# Patient Record
Sex: Male | Born: 1940 | Race: Black or African American | Hispanic: No | Marital: Single | Smoking: Former smoker
Health system: Southern US, Community
[De-identification: ages and names within clinical notes are randomized; demographics above are authoritative.]

## PROBLEM LIST (undated history)

## (undated) DIAGNOSIS — I10 Essential (primary) hypertension: Secondary | ICD-10-CM

## (undated) DIAGNOSIS — J449 Chronic obstructive pulmonary disease, unspecified: Secondary | ICD-10-CM

## (undated) DIAGNOSIS — I2699 Other pulmonary embolism without acute cor pulmonale: Secondary | ICD-10-CM

## (undated) DIAGNOSIS — I2510 Atherosclerotic heart disease of native coronary artery without angina pectoris: Secondary | ICD-10-CM

## (undated) DIAGNOSIS — I509 Heart failure, unspecified: Secondary | ICD-10-CM

## (undated) DIAGNOSIS — E119 Type 2 diabetes mellitus without complications: Secondary | ICD-10-CM

## (undated) HISTORY — PX: IVC FILTER INSERTION: CATH118245

## (undated) HISTORY — PX: PENILE PROSTHESIS IMPLANT: SHX240

---

## 2003-03-21 ENCOUNTER — Emergency Department (HOSPITAL_COMMUNITY): Admission: EM | Admit: 2003-03-21 | Discharge: 2003-03-21 | Payer: Self-pay | Admitting: Emergency Medicine

## 2004-10-25 ENCOUNTER — Emergency Department (HOSPITAL_COMMUNITY): Admission: EM | Admit: 2004-10-25 | Discharge: 2004-10-25 | Payer: Self-pay | Admitting: Emergency Medicine

## 2004-12-13 ENCOUNTER — Ambulatory Visit: Payer: Self-pay | Admitting: Family Medicine

## 2005-01-04 ENCOUNTER — Ambulatory Visit: Payer: Self-pay | Admitting: *Deleted

## 2005-01-08 ENCOUNTER — Emergency Department (HOSPITAL_COMMUNITY): Admission: EM | Admit: 2005-01-08 | Discharge: 2005-01-08 | Payer: Self-pay | Admitting: Emergency Medicine

## 2005-01-31 ENCOUNTER — Ambulatory Visit: Payer: Self-pay | Admitting: Nurse Practitioner

## 2005-03-26 ENCOUNTER — Ambulatory Visit: Payer: Self-pay | Admitting: Family Medicine

## 2005-03-28 ENCOUNTER — Ambulatory Visit: Payer: Self-pay | Admitting: Family Medicine

## 2005-07-05 ENCOUNTER — Emergency Department (HOSPITAL_COMMUNITY): Admission: EM | Admit: 2005-07-05 | Discharge: 2005-07-05 | Payer: Self-pay | Admitting: Emergency Medicine

## 2005-08-16 ENCOUNTER — Ambulatory Visit: Payer: Self-pay | Admitting: Family Medicine

## 2005-08-17 ENCOUNTER — Encounter (INDEPENDENT_AMBULATORY_CARE_PROVIDER_SITE_OTHER): Payer: Self-pay | Admitting: Family Medicine

## 2005-09-02 ENCOUNTER — Emergency Department (HOSPITAL_COMMUNITY): Admission: EM | Admit: 2005-09-02 | Discharge: 2005-09-02 | Payer: Self-pay | Admitting: Emergency Medicine

## 2005-10-11 ENCOUNTER — Emergency Department (HOSPITAL_COMMUNITY): Admission: EM | Admit: 2005-10-11 | Discharge: 2005-10-11 | Payer: Self-pay | Admitting: Emergency Medicine

## 2006-01-04 ENCOUNTER — Ambulatory Visit: Payer: Self-pay | Admitting: Family Medicine

## 2006-10-31 ENCOUNTER — Encounter (INDEPENDENT_AMBULATORY_CARE_PROVIDER_SITE_OTHER): Payer: Self-pay | Admitting: Family Medicine

## 2006-10-31 DIAGNOSIS — I10 Essential (primary) hypertension: Secondary | ICD-10-CM

## 2006-10-31 DIAGNOSIS — G609 Hereditary and idiopathic neuropathy, unspecified: Secondary | ICD-10-CM | POA: Insufficient documentation

## 2006-10-31 DIAGNOSIS — E119 Type 2 diabetes mellitus without complications: Secondary | ICD-10-CM | POA: Insufficient documentation

## 2006-10-31 DIAGNOSIS — R569 Unspecified convulsions: Secondary | ICD-10-CM

## 2006-12-06 ENCOUNTER — Ambulatory Visit: Payer: Self-pay | Admitting: Family Medicine

## 2006-12-11 ENCOUNTER — Ambulatory Visit: Payer: Self-pay | Admitting: Family Medicine

## 2007-01-10 ENCOUNTER — Ambulatory Visit: Payer: Self-pay | Admitting: Family Medicine

## 2007-01-17 ENCOUNTER — Ambulatory Visit: Payer: Self-pay | Admitting: Family Medicine

## 2007-03-27 ENCOUNTER — Ambulatory Visit: Payer: Self-pay | Admitting: Family Medicine

## 2007-04-08 ENCOUNTER — Inpatient Hospital Stay (HOSPITAL_BASED_OUTPATIENT_CLINIC_OR_DEPARTMENT_OTHER): Admission: RE | Admit: 2007-04-08 | Discharge: 2007-04-08 | Payer: Self-pay | Admitting: *Deleted

## 2007-09-11 ENCOUNTER — Ambulatory Visit: Payer: Self-pay | Admitting: Family Medicine

## 2007-10-06 ENCOUNTER — Ambulatory Visit: Payer: Self-pay | Admitting: Family Medicine

## 2008-06-10 ENCOUNTER — Ambulatory Visit: Payer: Self-pay | Admitting: Family Medicine

## 2008-06-11 ENCOUNTER — Encounter: Admission: RE | Admit: 2008-06-11 | Discharge: 2008-06-11 | Payer: Self-pay | Admitting: Family Medicine

## 2008-08-17 ENCOUNTER — Emergency Department (HOSPITAL_COMMUNITY): Admission: EM | Admit: 2008-08-17 | Discharge: 2008-08-17 | Payer: Self-pay | Admitting: Emergency Medicine

## 2008-09-02 ENCOUNTER — Ambulatory Visit: Payer: Self-pay | Admitting: Family Medicine

## 2010-05-16 NOTE — Cardiovascular Report (Signed)
NAME:  Jason Joyce, Jason Joyce NO.:  [id number]   MEDICAL RECORD NO.:  [id number]          PATIENT TYPE:  OIB   LOCATION:  1962                         FACILITY:  [facility]   PHYSICIAN:  Elmore Guise., M.D.DATE OF BIRTH:  [birth year]   DATE OF PROCEDURE:  04/08/2007  DATE OF DISCHARGE:                            CARDIAC CATHETERIZATION   INDICATIONS FOR PROCEDURE:  Increasing exertional dyspnea, chest pain  and abnormal stress test.   DESCRIPTION OF PROCEDURE:  The patient was brought to the cardiac cath  lab.  After appropriate informed consent he is prepped and draped in  sterile fashion.  Approximately 15 mL of 1% lidocaine was used for local  anesthesia.  A 4-French sheath placed in the right femoral artery  without difficulty.  Coronary angiography, LV angiography were then  performed.  The patient was transferred from the cardiac cath lab in  stable condition.   FINDINGS:  1. Left Main:  Normal.  2. Left anterior descending:  Large vessel with mild luminal      irregularities.  3. D1:  Mild luminal irregularities.  4. Ramus intermedius:  Moderate-sized vessel mild luminal      irregularities.  5. Left circumflex:  Nondominant large vessel mild luminal      irregularities.  6. Obtuse marginal 1:  Large vessel mild luminal irregularities.  7. Right coronary artery:  Dominant with mild luminal irregularities.  8. Left ventricle:  Ejection fraction is 55-60%.  No wall motion      abnormalities.  LV EDP is 28 mmHg.   IMPRESSION:  1. 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  2. Preserved left ventricular systolic function with an ejection      fraction 55%.  3. Elevated LV EDP at 28 mmHg.   PLAN:  At this time I would recommend continued aggressive medical  treatment as well as risk factor modification as indicated.      Elmore Guise., M.D.  Electronically Signed     TWK/MEDQ  D:  04/08/2007  T:  04/08/2007  Job:   [id number]

## 2010-05-16 NOTE — H&P (Signed)
NAME:  Jason Joyce, Jason Joyce NO.:  [id number]   MEDICAL RECORD NO.:  [id number]           PATIENT TYPE:   LOCATION:                                 FACILITY:   PHYSICIAN:  Elmore Guise., M.D.DATE OF BIRTH:  06-07-1940   DATE OF ADMISSION:  DATE OF DISCHARGE:                              HISTORY & PHYSICAL   INDICATION FOR PROCEDURE:  Exertional dyspnea and chest tightness.  Patient with abnormal stress test, now referred for cardiac  catheterization.   HISTORY OF PRESENT ILLNESS:  Jason Joyce is a 70 year old, African-  American male with past medical history of diabetes mellitus,  dyslipidemia, hypertension, obesity who presented for evaluation of  increasing exertional dyspnea.  He also reports occasional episodes of  chest tightness.  He was referred for exercise stress testing.  This was  done on March 31.  This showed abnormal study with a small reversible  inferior wall defect and an EF of 37%.  He has been seen back in the  office.  He continues to have exertional dyspnea.  He was started on  Coreg 3.125 mg twice daily.  He is now referred for cardiac  catheterization.  He denies any recent fever or cough.  He does have  occasional lower extremity edema as well as occasional episodes of  palpitations.  He had no presyncope or syncope.  He reports compliance  with his medications.   REVIEW OF SYSTEMS:  Are as per HPI.  All others are negative.   CURRENT MEDICATIONS:  1. Actos Plus 15/850 mg twice daily.  2. Lovastatin 20 mg daily.  3. Lisinopril hydrochlorothiazide 10/12.5 mg daily.  4. Glipizide 5 mg twice daily.  5. Aspirin 81 mg daily.   PHYSICAL EXAMINATION:  His weight is 246 pounds.  His blood pressure is  130/90.  His heart rate is 64 and regular.  In general, he is a very pleasant middle-aged, African-American male,  alert and oriented x4 in no acute distress.  He has no JVD and no  bruits.  LUNGS:  Were clear with good breath sounds at the  bases.  HEART:  Is regular with normal S1-S2.  ABDOMEN:  Is obese, soft, nontender, nondistended.  No rebound or  guarding.  EXTREMITIES:  Warm with 2+ pulses and no significant edema.  He has 2+  femoral pulse without any bruit noted.   His stress test was reviewed with him at length.  His echocardiogram was  also reviewed.  His echocardiogram showed an EF of 55-60% with diastolic  dysfunction, mild aortic valve sclerosis without stenosis, and trivial  mitral valve regurgitation.  His nuclear study with gaited imaging  showed a decrease in EF after stress with an EF estimated at 37%, and  with this small reversible inferior wall defect, he will now be referred  for cardiac catheterization.  His blood work is pending at time of  dictation.  His last chest x-ray was done March 27, 2007 and was normal.   IMPRESSION:  1. Abnormal stress Cardiolite.  2. Multiple cardiac risk factors including hypertension, diabetes,  dyslipidemia, morbid obesity.   PLAN:  The patient will be scheduled for cardiac catheterization.  He  was started on Coreg 3.125 mg twice daily.  He will have routine blood  work done today prior to his study next Tuesday.  All his questions were  answered.  He does wish to proceed.      Elmore Guise., M.D.  Electronically Signed     TWK/MEDQ  D:  04/04/2007  T:  04/04/2007  Job:  [id number]

## 2019-04-12 ENCOUNTER — Other Ambulatory Visit: Payer: Self-pay

## 2019-04-12 ENCOUNTER — Encounter (HOSPITAL_COMMUNITY): Payer: Self-pay | Admitting: Emergency Medicine

## 2019-04-12 ENCOUNTER — Emergency Department (HOSPITAL_COMMUNITY): Payer: Medicare PPO

## 2019-04-12 ENCOUNTER — Emergency Department (HOSPITAL_COMMUNITY)
Admission: EM | Admit: 2019-04-12 | Discharge: 2019-04-13 | Disposition: A | Discharge status: Home / Self Care | Payer: Medicare PPO | Attending: Emergency Medicine | Admitting: Emergency Medicine

## 2019-04-12 DIAGNOSIS — E119 Type 2 diabetes mellitus without complications: Secondary | ICD-10-CM | POA: Diagnosis not present

## 2019-04-12 DIAGNOSIS — I10 Essential (primary) hypertension: Secondary | ICD-10-CM | POA: Insufficient documentation

## 2019-04-12 DIAGNOSIS — R072 Precordial pain: Secondary | ICD-10-CM | POA: Insufficient documentation

## 2019-04-12 DIAGNOSIS — R0602 Shortness of breath: Secondary | ICD-10-CM | POA: Insufficient documentation

## 2019-04-12 DIAGNOSIS — R079 Chest pain, unspecified: Secondary | ICD-10-CM | POA: Diagnosis present

## 2019-04-12 LAB — CBC
HCT: 35.2 % — ABNORMAL LOW (ref 39.0–52.0)
Hemoglobin: 10.8 g/dL — ABNORMAL LOW (ref 13.0–17.0)
MCH: 27.3 pg (ref 26.0–34.0)
MCHC: 30.7 g/dL (ref 30.0–36.0)
MCV: 89.1 fL (ref 80.0–100.0)
Platelets: 265 10*3/uL (ref 150–400)
RBC: 3.95 MIL/uL — ABNORMAL LOW (ref 4.22–5.81)
RDW: 13.6 % (ref 11.5–15.5)
WBC: 5 10*3/uL (ref 4.0–10.5)
nRBC: 0 % (ref 0.0–0.2)

## 2019-04-12 LAB — COMPREHENSIVE METABOLIC PANEL
ALT: 17 U/L (ref 0–44)
AST: 19 U/L (ref 15–41)
Albumin: 3.2 g/dL — ABNORMAL LOW (ref 3.5–5.0)
Alkaline Phosphatase: 93 U/L (ref 38–126)
Anion gap: 9 (ref 5–15)
BUN: 18 mg/dL (ref 8–23)
CO2: 24 mmol/L (ref 22–32)
Calcium: 9 mg/dL (ref 8.9–10.3)
Chloride: 104 mmol/L (ref 98–111)
Creatinine, Ser: 1.35 mg/dL — ABNORMAL HIGH (ref 0.61–1.24)
GFR calc Af Amer: 57 mL/min — ABNORMAL LOW (ref >60)
GFR calc non Af Amer: 50 mL/min — ABNORMAL LOW (ref >60)
Glucose, Bld: 349 mg/dL — ABNORMAL HIGH (ref 70–99)
Potassium: 4.2 mmol/L (ref 3.5–5.1)
Sodium: 137 mmol/L (ref 135–145)
Total Bilirubin: 0.4 mg/dL (ref 0.3–1.2)
Total Protein: 6.7 g/dL (ref 6.5–8.1)

## 2019-04-12 LAB — PROTIME-INR
INR: 1 (ref 0.8–1.2)
Prothrombin Time: 13 seconds (ref 11.4–15.2)

## 2019-04-12 LAB — BRAIN NATRIURETIC PEPTIDE: B Natriuretic Peptide: 17.1 pg/mL (ref 0.0–100.0)

## 2019-04-12 LAB — TROPONIN I (HIGH SENSITIVITY): Troponin I (High Sensitivity): 22 ng/L — ABNORMAL HIGH (ref <18)

## 2019-04-12 MED ORDER — INSULIN ASPART 100 UNIT/ML ~~LOC~~ SOLN
12.0000 [IU] | Freq: Once | SUBCUTANEOUS | Status: AC
  Administered 2019-04-13: 12 [IU] via SUBCUTANEOUS

## 2019-04-12 NOTE — ED Provider Notes (Signed)
Plan to f/u on repeat troponin Check med recs May be amenable for discharge   Zadie Rhine, MD 04/12/19 2331

## 2019-04-12 NOTE — ED Provider Notes (Signed)
[facility] EMERGENCY DEPARTMENT Provider Note   CSN: [id number] Arrival date & time: 04/12/19  2206     History Chief Complaint  Patient presents with  . Chest Pain    Jason Joyce is a 79 y.o. male.  Patient with hx htn, dm, presents c/o mid chest pain in past couple days. Symptoms occur at rest, moderate, constant, persistent, dull, non radiating. No associated nv, or diaphoresis. No pleuritic pain. Mild sob/doe. +bilateral lower leg/ankle edema. Pt indicates he has been out of his medications for 'long while'. States he is from [place], and that more recently is living in [place] as family in area, but has no local doctor, and not been able to get his meds since leaving [place]. Also states wants to get his penile prosthesis checked to make sure it isnt leaking. No acute scrotal, testicular or penile pain, no skin lesions, drainage or redness to area. No fever or chills.   The history is provided by the patient.  Chest Pain Associated symptoms: cough and shortness of breath   Associated symptoms: no abdominal pain, no back pain, no fever, no headache, no palpitations and no vomiting        History reviewed. No pertinent past medical history.  Patient Active Problem List   Diagnosis Date Noted  . DIABETES MELLITUS, TYPE II 10/31/2006  . PERIPHERAL NEUROPATHY 10/31/2006  . HYPERTENSION 10/31/2006  . SEIZURE DISORDER 10/31/2006    History reviewed. No pertinent surgical history.     History reviewed. No pertinent family history.  Social History   Tobacco Use  . Smoking status: Not on file  Substance Use Topics  . Alcohol use: Not on file  . Drug use: Not on file    Home Medications Prior to Admission medications   Not on File    Allergies    Patient has no allergy information on record.  Review of Systems   Review of Systems  Constitutional: Negative for chills and fever.  HENT: Negative for sore throat.   Eyes: Negative for redness.    Respiratory: Positive for cough and shortness of breath.   Cardiovascular: Positive for chest pain and leg swelling. Negative for palpitations.  Gastrointestinal: Negative for abdominal pain, diarrhea and vomiting.  Endocrine: Negative for polyuria.  Genitourinary: Negative for dysuria and flank pain.  Musculoskeletal: Negative for back pain and neck pain.  Skin: Negative for rash.  Neurological: Negative for headaches.  Hematological: Does not bruise/bleed easily.  Psychiatric/Behavioral: Negative for confusion.    Physical Exam Updated Vital Signs BP 139/89 (BP Location: Right Arm)   Pulse 98   Temp 98.4 F (36.9 C) (Oral)   Resp (!) 24   Ht 1.803 m (5\' 11" )   Wt 124.7 kg   SpO2 94%   BMI 38.35 kg/m   Physical Exam Vitals and nursing note reviewed.  Constitutional:      Appearance: Normal appearance. He is well-developed.  HENT:     Head: Atraumatic.     Nose: Nose normal.     Mouth/Throat:     Mouth: Mucous membranes are moist.     Pharynx: Oropharynx is clear.  Eyes:     General: No scleral icterus.    Conjunctiva/sclera: Conjunctivae normal.     Pupils: Pupils are equal, round, and reactive to light.  Neck:     Trachea: No tracheal deviation.  Cardiovascular:     Rate and Rhythm: Normal rate and regular rhythm.     Pulses: Normal pulses.  Heart sounds: Normal heart sounds. No murmur. No friction rub. No gallop.   Pulmonary:     Effort: Pulmonary effort is normal. No accessory muscle usage or respiratory distress.     Breath sounds: Normal breath sounds.  Abdominal:     General: Bowel sounds are normal. There is no distension.     Palpations: Abdomen is soft.     Tenderness: There is no abdominal tenderness. There is no guarding.  Genitourinary:    Comments: No cva tenderness. Normal external gu. Semi-erect penis/prosthesis. No skin changes, sts, or erythema to area. No focal tenderness.  Musculoskeletal:        General: No tenderness.     Cervical  back: Normal range of motion and neck supple. No rigidity.     Comments: Mild symmetric bilateral leg/ankle edema.   Skin:    General: Skin is warm and dry.     Findings: No rash.  Neurological:     Mental Status: He is alert.     Comments: Alert, speech clear.   Psychiatric:        Mood and Affect: Mood normal.     ED Results / Procedures / Treatments   Labs (all labs ordered are listed, but only abnormal results are displayed) Results for orders placed or performed during the hospital encounter of 04/12/19  CBC  Result Value Ref Range   WBC 5.0 4.0 - 10.5 K/uL   RBC 3.95 (L) 4.22 - 5.81 MIL/uL   Hemoglobin 10.8 (L) 13.0 - 17.0 g/dL   HCT 84.6 (L) 96.2 - 95.2 %   MCV 89.1 80.0 - 100.0 fL   MCH 27.3 26.0 - 34.0 pg   MCHC 30.7 30.0 - 36.0 g/dL   RDW 84.1 32.4 - 40.1 %   Platelets 265 150 - 400 K/uL   nRBC 0.0 0.0 - 0.2 %  Comprehensive metabolic panel  Result Value Ref Range   Sodium 137 135 - 145 mmol/L   Potassium 4.2 3.5 - 5.1 mmol/L   Chloride 104 98 - 111 mmol/L   CO2 24 22 - 32 mmol/L   Glucose, Bld 349 (H) 70 - 99 mg/dL   BUN 18 8 - 23 mg/dL   Creatinine, Ser 0.27 (H) 0.61 - 1.24 mg/dL   Calcium 9.0 8.9 - 25.3 mg/dL   Total Protein 6.7 6.5 - 8.1 g/dL   Albumin 3.2 (L) 3.5 - 5.0 g/dL   AST 19 15 - 41 U/L   ALT 17 0 - 44 U/L   Alkaline Phosphatase 93 38 - 126 U/L   Total Bilirubin 0.4 0.3 - 1.2 mg/dL   GFR calc non Af Amer 50 (L) >60 mL/min   GFR calc Af Amer 57 (L) >60 mL/min   Anion gap 9 5 - 15  Brain natriuretic peptide  Result Value Ref Range   B Natriuretic Peptide 17.1 0.0 - 100.0 pg/mL  Protime-INR  Result Value Ref Range   Prothrombin Time 13.0 11.4 - 15.2 seconds   INR 1.0 0.8 - 1.2  Troponin I (High Sensitivity)  Result Value Ref Range   Troponin I (High Sensitivity) 22 (H) <18 ng/L    EKG EKG Interpretation  Date/Time:  Sunday April 12 2019 22:20:19 EDT Ventricular Rate:  100 PR Interval:    QRS Duration: 144 QT Interval:  396 QTC  Calculation: 506 R Axis:   0 Text Interpretation: Sinus tachycardia Premature ventricular complexes Right bundle branch block Non-specific ST-t changes Baseline wander Confirmed by Cathren Laine ([id number])  on 04/12/2019 10:38:30 PM   Radiology DG Chest Port 1 View  Result Date: 04/12/2019 CLINICAL DATA:  Chest pain abdominal swelling EXAM: PORTABLE CHEST 1 VIEW COMPARISON:  06/11/2008 FINDINGS: Mild elevation of the left diaphragm with basilar atelectasis. Mild cardiomegaly with central vascular congestion. No pleural effusion or pneumothorax IMPRESSION: 1. Cardiomegaly with mild central vascular congestion 2. Mild left basilar atelectasis Electronically Signed   By: Jasmine Pang M.D.   On: 04/12/2019 23:15    Procedures Procedures (including critical care time)  Medications Ordered in ED Medications - No data to display  ED Course  I have reviewed the triage vital signs and the nursing notes.  Pertinent labs & imaging results that were available during my care of the patient were reviewed by me and considered in my medical decision making (see chart for details).    MDM Rules/Calculators/A&P                      Iv ns. Stat labs. Pcxr. Ecg.   Reviewed nursing notes and prior charts for additional history.  No recent records available in Care Everywhere.   Initial labs reviewed/interpreted by me  - wbc normal, hgb 10.8.   CXR reviewed/interpreted by me - no pna. +mild vascular congestion.   Will ask pharmacy to do med rec - pt is unclear on meds/doses.   Additional labs reviewed/interpreted by me - glucose high, 349, hco3 normal. novolog sq.   Additional labs/bnp and delta trop remain pending - signed out to Dr Bebe Shaggy - check labs when resulted. If delta trop not increasing, potential d/c w close outpt f/u. If delta trop increasing, and/or bnp significantly high, would admit to medicine.      Final Clinical Impression(s) / ED Diagnoses Final diagnoses:  None    Rx / DC  Orders ED Discharge Orders    None       Cathren Laine, MD 04/12/19 2325

## 2019-04-12 NOTE — ED Triage Notes (Signed)
Pt BIB [facility], c/o chest pain, abdominal swelling/distension that started yesterday and BLE edema. Given 1NTG and 324mg  asa with no change in pain. Hx blood clots. Pt also concerned that his penile implant has busted, and he thinks it is leaking. A&O x 4.

## 2019-04-13 LAB — TROPONIN I (HIGH SENSITIVITY): Troponin I (High Sensitivity): 20 ng/L — ABNORMAL HIGH (ref <18)

## 2019-04-13 NOTE — ED Notes (Signed)
States he has not had any medicine for 9-10 months, since he was in a facility in [place] for covid

## 2019-04-13 NOTE — ED Provider Notes (Signed)
Social workers evaluated the patient and have established a plan for him for follow-up.  We will discharge him.   Melene Plan, DO 04/13/19 [phone]

## 2019-04-13 NOTE — ED Provider Notes (Signed)
Repeat troponin improved.  Patient is in no acute distress. Patient has multiple issues that need to be addressed including transportation home, PCP  follow-up, medication assistance. He does not have any local primary care and cannot recall all of his medications. Due to the time of night and difficulty getting home, patient will remain in the ER until the morning and see social work.  They can assist him with primary care placement this week as well as medication assistant.  It will need to be during the daylight to contact the pharmacy to ensure which medications he may need. Patient otherwise stable and appropriate   Zadie Rhine, MD 04/13/19 0151

## 2019-04-13 NOTE — ED Notes (Signed)
Pt states that he can get a ride home, by a church member.

## 2019-04-22 ENCOUNTER — Inpatient Hospital Stay (INDEPENDENT_AMBULATORY_CARE_PROVIDER_SITE_OTHER): Payer: Medicare PPO | Admitting: Primary Care

## 2019-07-03 ENCOUNTER — Other Ambulatory Visit: Payer: Self-pay | Admitting: General Practice

## 2019-07-03 DIAGNOSIS — T8361XA Infection and inflammatory reaction due to implanted penile prosthesis, initial encounter: Secondary | ICD-10-CM

## 2019-07-07 ENCOUNTER — Ambulatory Visit (INDEPENDENT_AMBULATORY_CARE_PROVIDER_SITE_OTHER): Payer: Medicare PPO | Admitting: Ophthalmology

## 2019-07-07 ENCOUNTER — Encounter (INDEPENDENT_AMBULATORY_CARE_PROVIDER_SITE_OTHER): Payer: Self-pay | Admitting: Ophthalmology

## 2019-07-07 ENCOUNTER — Other Ambulatory Visit: Payer: Self-pay

## 2019-07-07 DIAGNOSIS — H211X1 Other vascular disorders of iris and ciliary body, right eye: Secondary | ICD-10-CM

## 2019-07-07 DIAGNOSIS — H34811 Central retinal vein occlusion, right eye, with macular edema: Secondary | ICD-10-CM | POA: Diagnosis not present

## 2019-07-07 DIAGNOSIS — H2513 Age-related nuclear cataract, bilateral: Secondary | ICD-10-CM

## 2019-07-07 MED ORDER — BEVACIZUMAB CHEMO INJECTION 1.25MG/0.05ML SYRINGE FOR KALEIDOSCOPE
1.2500 mg | INTRAVITREAL | Status: AC | PRN
  Administered 2019-07-07: 1.25 mg via INTRAVITREAL

## 2019-07-07 NOTE — Progress Notes (Signed)
07/07/2019     CHIEF COMPLAINT Patient presents for Retina Evaluation   HISTORY OF PRESENT ILLNESS: Jason Joyce is a 79 y.o. male who presents to the clinic today for:   HPI    Retina Evaluation    In right eye.  This started 2 years ago.  Duration of 2 years.  Associated Symptoms Blind Spot.  Context:  distance vision, mid-range vision and near vision.          Comments    NP CME OU, CRVO OD - Ref'd by Dr. Ernesto Rutherford  Pt c/o loss of VA OD x 1-2 years. Pt sts, "I can't remember the doctor, but they were shooting a needle in my eyeball." Pt denies noticeable changes to VA OS. LBS: 107 this AM A1c: pt does not recall, "I know it was high."  Patient last injected in the right eyes some 1 year previously.  He reports having caught the virus and then with hospitalization was unable to return to the practice.       Last edited by Edmon Crape, MD on 07/07/2019  9:38 AM. (History)      Referring physician: Ernesto Rutherford, MD [address] [place],  [place] [address]  HISTORICAL INFORMATION:   Selected notes from the MEDICAL RECORD NUMBER       CURRENT MEDICATIONS: Current Outpatient Medications (Ophthalmic Drugs)  Medication Sig  . latanoprost (XALATAN) 0.005 % ophthalmic solution    No current facility-administered medications for this visit. (Ophthalmic Drugs)   No current outpatient medications on file. (Other)   No current facility-administered medications for this visit. (Other)      REVIEW OF SYSTEMS:    ALLERGIES No Known Allergies  PAST MEDICAL HISTORY History reviewed. No pertinent past medical history. History reviewed. No pertinent surgical history.  FAMILY HISTORY History reviewed. No pertinent family history.  SOCIAL HISTORY Social History   Tobacco Use  . Smoking status: Former Games developer  . Smokeless tobacco: Never Used  Substance Use Topics  . Alcohol use: Not on file  . Drug use: Not on file         OPHTHALMIC EXAM:  Base  Eye Exam    Visual Acuity (ETDRS)      Right Left   Dist [place] HM 20/60 -2   Dist ph  NI 20/40 -2       Tonometry (Tonopen, 9:21 AM)      Right Left   Pressure 31 14       Tonometry #2 (Tonopen, 9:21 AM)      Right Left   Pressure 32        Pupils      Pupils Dark Light Shape React APD   Right PERRL 4 3 Round Brisk +1   Left PERRL 4 3 Round Brisk None       Visual Fields (Counting fingers)      Left Right    Full    Restrictions  Total superior temporal, inferior temporal, superior nasal, inferior nasal deficiencies       Extraocular Movement      Right Left    Full Full       Neuro/Psych    Oriented x3: Yes   Mood/Affect: Normal       Dilation    Left eye: 1.0% Mydriacyl, 2.5% Phenylephrine @ 9:24 AM        Slit Lamp and Fundus Exam    External Exam      Right  Left   External Normal Normal       Slit Lamp Exam      Right Left   Lids/Lashes Normal Normal   Conjunctiva/Sclera White and quiet White and quiet   Cornea Clear Clear   Anterior Chamber Deep and quiet Deep and quiet   Iris Neovascularization,.  Pupillary nearly 360 Round and reactive   Lens 2+ Nuclear sclerosis 2+ Nuclear sclerosis   Anterior Vitreous Normal Normal          IMAGING AND PROCEDURES  Imaging and Procedures for 07/07/19  OCT, Retina - OU - Both Eyes       Right Eye Quality was good. Scan locations included subfoveal. Central Foveal Thickness: 698. Progression has worsened. Findings include abnormal foveal contour, cystoid macular edema.   Left Eye Quality was good. Scan locations included subfoveal. Central Foveal Thickness: 249. Progression has no prior data. Findings include abnormal foveal contour, vitreomacular adhesion .   Notes Massive retinal thickening deep, likely from ischemic central retinal vein occlusion.  We will resume intravitreal antiveg F therapy OD promptly   OS, some foveal atrophy temporal aspect of macula       Intravitreal Injection,  Pharmacologic Agent - OD - Right Eye       Time Out 07/07/2019. 9:54 AM. Confirmed correct patient, procedure, site, and patient consented.   Anesthesia Topical anesthesia was used. Anesthetic medications included Akten 3.5%.   Procedure Preparation included Tobramycin 0.3%, 10% betadine to eyelids. A 30 gauge needle was used.   Injection:  1.25 mg Bevacizumab (AVASTIN) SOLN   NDC: [address]-2, Lot: [id number]   Route: Intravitreal, Site: Right Eye, Waste: 0 mg  Post-op Post injection exam found visual acuity of at least counting fingers. The patient tolerated the procedure well. There were no complications. The patient received written and verbal post procedure care education. Post injection medications were not given.                 ASSESSMENT/PLAN:  No problem-specific Assessment & Plan notes found for this encounter.      ICD-10-CM   1. 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 Nuclear sclerotic cataract of both eyes  H25.13     1.  OD, not dilated today due to early elevated intraocular pressure.  2.  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  3.  Vitreal Avastin OD today 4.  With neovascularization of the iris, and neovascular disease, will need to return for follow-up visit for PRP #1 in 2 weeks  Ophthalmic Meds Ordered this visit:  Meds ordered this encounter  Medications  . Bevacizumab (AVASTIN) SOLN 1.25 mg       Return in about 4 weeks (around 08/04/2019) for dilate, OD, AVASTIN OCT.  There are no Patient Instructions on file for this visit.   Explained the diagnoses, plan, and follow up with the patient and they expressed understanding.   Patient expressed understanding of the importance of proper follow up care.   Alford Highland Luisangel Wainright M.D. Diseases & Surgery of the Retina and Vitreous [facility] 07/07/19     Abbreviations: M myopia (nearsighted); A astigmatism; H hyperopia (  farsighted); P presbyopia; Mrx spectacle prescription;  CTL contact lenses; OD right eye; OS left eye; OU both eyes  XT exotropia; ET esotropia; PEK punctate epithelial keratitis; PEE punctate epithelial erosions; DES dry eye syndrome; MGD meibomian gland dysfunction; ATs artificial tears; PFAT's preservative free artificial tears; NSC nuclear sclerotic cataract; PSC posterior subcapsular cataract; ERM epi-retinal membrane; PVD posterior vitreous detachment; RD retinal detachment; DM diabetes mellitus; DR diabetic retinopathy; NPDR non-proliferative diabetic retinopathy; PDR proliferative diabetic retinopathy; CSME clinically significant macular edema; DME diabetic macular edema; dbh dot blot hemorrhages; CWS cotton wool spot; POAG primary open angle glaucoma; C/D cup-to-disc ratio; HVF humphrey visual field; GVF goldmann visual field; OCT optical coherence tomography; IOP intraocular pressure; BRVO Branch retinal vein occlusion; CRVO central retinal vein occlusion; CRAO central retinal artery occlusion; BRAO branch retinal artery occlusion; RT retinal tear; SB scleral buckle; PPV pars plana vitrectomy; VH Vitreous hemorrhage; PRP panretinal laser photocoagulation; IVK intravitreal kenalog; VMT vitreomacular traction; MH Macular hole;  NVD neovascularization of the disc; NVE neovascularization elsewhere; AREDS age related eye disease study; ARMD age related macular degeneration; POAG primary open angle glaucoma; EBMD epithelial/anterior basement membrane dystrophy; ACIOL anterior chamber intraocular lens; IOL intraocular lens; PCIOL posterior chamber intraocular lens; Phaco/IOL phacoemulsification with intraocular lens placement; PRK photorefractive  keratectomy; LASIK laser assisted in situ keratomileusis; HTN hypertension; DM diabetes mellitus; COPD chronic obstructive pulmonary disease

## 2019-07-08 ENCOUNTER — Other Ambulatory Visit: Payer: Self-pay

## 2019-07-08 LAB — HEMOGLOBIN A1C: A1c: 11.1

## 2019-07-10 ENCOUNTER — Ambulatory Visit
Admission: RE | Admit: 2019-07-10 | Discharge: 2019-07-10 | Disposition: A | Discharge status: Home / Self Care | Payer: Medicare PPO | Source: Ambulatory Visit | Attending: General Practice | Admitting: General Practice

## 2019-07-10 DIAGNOSIS — T8361XA Infection and inflammatory reaction due to implanted penile prosthesis, initial encounter: Secondary | ICD-10-CM

## 2019-07-14 ENCOUNTER — Ambulatory Visit (INDEPENDENT_AMBULATORY_CARE_PROVIDER_SITE_OTHER): Payer: Medicare PPO

## 2019-07-14 ENCOUNTER — Encounter: Payer: Self-pay | Admitting: Internal Medicine

## 2019-07-14 ENCOUNTER — Other Ambulatory Visit: Payer: Self-pay

## 2019-07-14 ENCOUNTER — Ambulatory Visit (INDEPENDENT_AMBULATORY_CARE_PROVIDER_SITE_OTHER): Payer: Medicare PPO | Admitting: Internal Medicine

## 2019-07-14 DIAGNOSIS — I10 Essential (primary) hypertension: Secondary | ICD-10-CM | POA: Diagnosis not present

## 2019-07-14 DIAGNOSIS — R0600 Dyspnea, unspecified: Secondary | ICD-10-CM | POA: Diagnosis not present

## 2019-07-14 DIAGNOSIS — Z95828 Presence of other vascular implants and grafts: Secondary | ICD-10-CM | POA: Insufficient documentation

## 2019-07-14 DIAGNOSIS — R0609 Other forms of dyspnea: Secondary | ICD-10-CM

## 2019-07-14 MED ORDER — DOXAZOSIN MESYLATE 4 MG PO TABS: 4.0000 mg | ORAL_TABLET | Freq: Two times a day (BID) | ORAL | 2 refills | Status: AC

## 2019-07-14 NOTE — Assessment & Plan Note (Addendum)
Due to leg swelling/ difficulty urinating >>> D/c nifedipine  rx cardura 4 mg bid 07/14/2019 >>>        Medical decision making was a high  level of complexity in this case because of >  two chronic conditions /diagnoses requiring extra time for  H and P, chart review, counseling,  directly observing portions of ambulatory 02 saturation study/   and generating customized AVS unique to this office visit and charting.   Each maintenance medication was reviewed in detail including emphasizing most importantly the difference between maintenance and prns and under what circumstances the prns are to be triggered using an action plan format where appropriate. Please see avs for details which were reviewed in writing by both me and my nurse and patient given a written copy highlighted where appropriate with yellow highlighter for the patient's continued care at home along with an updated version of their medications.  Patient was asked to maintain medication reconciliation by comparing this list to the actual medications being used at home and to contact this office right away if there is a conflict or discrepancy.

## 2019-07-14 NOTE — Progress Notes (Addendum)
Jason Joyce, male    DOB: [birth year],    MRN: [id number]   Brief patient profile:  37 yobm   Quit smoking 01/1994 s obvious resp sequelae  living in [place] with h/o dvt/ filter  By [facility] "years ago"  admitted to [facility]  hospital "  x 3-4  months" with dx of covid 19 pna and d/c off of 02  But severely debilitated and moved to [place] "no one would take me since I had covid" and referred to pulmonary clinic 07/14/2019 by Dr   Allena Katz for DOE ? Copd      History of Present Illness  07/14/2019  Pulmonary/ 1st office eval/Jason Joyce  Chief Complaint  Patient presents with   Pulmonary Consult    Referred by Dr Allena Katz for eval of COPD. Pt c/o SOB since had Covid 19 around Sept 2020.   Dyspnea:  MMRC3 = can't walk 100 yards even at a slow pace at a flat grade s stopping due to sob  Usually uses scooter at [employer] previous to covid could usually just push a cart  Cough none Sleep: freq urination/ no resp cc's SABA use: none / they don't help  No obvious day to day or daytime variability or assoc excess/ purulent sputum or mucus plugs or hemoptysis or cp or chest tightness, subjective wheeze or overt sinus or hb symptoms.   Sleeping  without nocturnal  or early am exacerbation  of respiratory  c/o's or need for noct saba. Also denies any obvious fluctuation of symptoms with weather or environmental changes or other aggravating or alleviating factors except as outlined above   No unusual exposure hx or h/o childhood pna/ asthma or knowledge of premature birth.  Current Allergies, Complete Past Medical History, Past Surgical History, Family History, and Social History were reviewed in [employer] record.  ROS  The following are not active complaints unless bolded Hoarseness, sore throat, dysphagia, dental problems, itching, sneezing,  nasal congestion or discharge of excess mucus or purulent secretions, ear ache,   fever, chills, sweats, unintended wt loss or wt gain,  classically pleuritic or exertional cp,  orthopnea pnd or arm/hand swelling  or leg swelling, presyncope, palpitations, abdominal pain, anorexia, nausea, vomiting, diarrhea  or change in bowel habits or change in bladder habits= freq urination , change in stools or change in urine, dysuria, hematuria,  rash, arthralgias, visual complaints= blind in OD, headache, numbness, weakness or ataxia or problems with walking or coordination,  change in mood or  memory.           No past medical history on file.  Outpatient Medications Prior to Visit - - NOTE:   Unable to verify as accurately reflecting what pt takes     Medication Sig Dispense Refill   Baclofen 5 MG TABS      ELIQUIS 5 MG TABS tablet      latanoprost (XALATAN) 0.005 % ophthalmic solution      losartan (COZAAR) 100 MG tablet      metFORMIN (GLUCOPHAGE) 1000 MG tablet Take 1,000 mg by mouth 2 (two) times daily with a meal.     metoprolol tartrate (LOPRESSOR) 50 MG tablet      NIFEdipine (ADALAT CC) 90 MG 24 hr tablet      cefdinir (OMNICEF) 300 MG capsule         Objective:     BP (!) 164/78 (BP Location: Left Arm, Cuff Size: Normal)    Pulse 88  Temp 98.2 F (36.8 C) (Oral)    Ht 5' 11.5" (1.816 m)    Wt 266 lb (120.7 kg)    SpO2 99% Comment: on RA   BMI 36.58 kg/m   SpO2: 99 % (on RA)   Hoarse amb obese bm nad / extremely HOH  HEENT : pt wearing mask not removed for exam due to covid - 19 concerns.   NECK :  without JVD/Nodes/TM/ nl carotid upstrokes bilaterally   LUNGS: no acc muscle use,  Min barrel  contour chest wall with bilateral  slightly decreased bs s audible wheeze and  without cough on insp or exp maneuvers and min  Hyperresonant  to  percussion bilaterally     CV:  RRR  no s3 or murmur or increase in P2, and 1+ pitting both LE's   ABD:  Quit tensely obese/ soft and nontender with pos end  insp Hoover's  in the supine position. No bruits or organomegaly appreciated, bowel sounds nl  MS:  Walks  with cane/ ext warm without deformities, calf tenderness, cyanosis or clubbing No obvious joint restrictions   SKIN: warm and dry without lesions    NEURO:  alert, approp, nl sensorium with  no motor or cerebellar deficits apparent.       CXR PA and Lateral:   07/14/2019 :    I personally reviewed images and   impression as follows:   Elevated L HD/ lungs clear     Labs  reviewed:      Chemistry      Component Value Date/Time   NA 137 04/12/2019 2213   K 4.2 04/12/2019 2213   CL 104 04/12/2019 2213   CO2 24 04/12/2019 2213   BUN 18 04/12/2019 2213   CREATININE 1.35 (H) 04/12/2019 2213      Component Value Date/Time   CALCIUM 9.0 04/12/2019 2213   ALKPHOS 93 04/12/2019 2213   AST 19 04/12/2019 2213   ALT 17 04/12/2019 2213   BILITOT 0.4 04/12/2019 2213        Lab Results  Component Value Date   WBC 5.0 04/12/2019   HGB 10.8 (L) 04/12/2019   HCT 35.2 (L) 04/12/2019   MCV 89.1 04/12/2019   PLT 265 04/12/2019          Assessment   DOE (dyspnea on exertion) Quit smoking 1996 - worse since covid 19 sept 2020 - 07/14/2019   Walked on RA x one lap =  approx 250 ft -@ relatively fast pace with cane stopped due to sob with sats still  99%    When respiratory symptoms begin or become refractory well after a patient reports complete smoking cessation,  Especially when this wasn't the case while they were smoking, a red flag is raised based on the work of Dr Primitivo Gauze which states:  if you quit smoking when your best day FEV1 is still well preserved it is highly unlikely you will progress to severe disease.  That is to say, once the smoking stops,  the symptoms should not suddenly erupt or markedly worsen.  If so, the differential diagnosis should include  obesity/deconditioning,  LPR/Reflux/Aspiration syndromes,  occult CHF, or  especially side effect of medications commonly used in this population.     Will hold off rx for copd as doubt he has much or that it is his  limiting problem and note it will be a major challenge if he does have it in terms of teaching about inhaler options given his  hearing difficulty.     Presence of IVC filter Placed by [facility] ? Date   Complicated by venous insuff > rec elevate/ wrap/ d/c nifedipine (see hbp) / continue eliquis   Essential hypertension Due to leg swelling/ difficulty urinating >>> D/c nifedipine  rx cardura 4 mg bid 07/14/2019 >>>        Medical decision making was a high  level of complexity in this case because of >  two chronic conditions /diagnoses requiring extra time for  H and P, chart review, counseling,  directly observing portions of ambulatory 02 saturation study/   and generating customized AVS unique to this office visit and charting.   Each maintenance medication was reviewed in detail including emphasizing most importantly the difference between maintenance and prns and under what circumstances the prns are to be triggered using an action plan format where appropriate. Please see avs for details which were reviewed in writing by both me and my nurse and patient given a written copy highlighted where appropriate with yellow highlighter for the patient's continued care at home along with an updated version of their medications.  Patient was asked to maintain medication reconciliation by comparing this list to the actual medications being used at home and to contact this office right away if there is a conflict or discrepancy.           Sandrea Hughs, MD 07/15/2019

## 2019-07-14 NOTE — Assessment & Plan Note (Addendum)
Quit smoking 1996 - worse since covid 19 sept 2020 - 07/14/2019   Walked on RA x one lap =  approx 250 ft -@ relatively fast pace with cane stopped due to sob with sats still  99%    When respiratory symptoms begin or become refractory well after a patient reports complete smoking cessation,  Especially when this wasn't the case while they were smoking, a red flag is raised based on the work of Dr Primitivo Gauze which states:  if you quit smoking when your best day FEV1 is still well preserved it is highly unlikely you will progress to severe disease.  That is to say, once the smoking stops,  the symptoms should not suddenly erupt or markedly worsen.  If so, the differential diagnosis should include  obesity/deconditioning,  LPR/Reflux/Aspiration syndromes,  occult CHF, or  especially side effect of medications commonly used in this population.     Will hold off rx for copd as doubt he has much or that it is his limiting problem and note it will be a major challenge if he does have it in terms of teaching about inhaler options given his hearing difficulty.

## 2019-07-14 NOTE — Assessment & Plan Note (Addendum)
Placed by [facility] ? Date   Complicated by venous insuff > rec elevate/ wrap/ d/c nifedipine (see hbp) / continue eliquis

## 2019-07-14 NOTE — Patient Instructions (Addendum)
I would not use Nifedipine due to your tendency to leg swelling and if it persists you will need a vein speciast (Dr  Bosie Helper group)  Try off nifedipine and on cardura 4 mg twice daily (should also help your urine flow)   Keep your legs elevated and wrapped as much as you can    Please remember to go to the  x-ray department  for your tests - we will call you with the results when they are available    Please schedule a follow up office visit in 6-8  weeks, call sooner if needed with pfts on return

## 2019-07-15 ENCOUNTER — Encounter: Payer: Self-pay | Admitting: Internal Medicine

## 2019-07-20 ENCOUNTER — Encounter (HOSPITAL_COMMUNITY): Payer: Self-pay | Admitting: *Deleted

## 2019-07-20 ENCOUNTER — Emergency Department (HOSPITAL_COMMUNITY): Payer: Medicare PPO

## 2019-07-20 ENCOUNTER — Other Ambulatory Visit: Payer: Self-pay

## 2019-07-20 ENCOUNTER — Emergency Department (HOSPITAL_COMMUNITY)
Admission: EM | Admit: 2019-07-20 | Discharge: 2019-07-20 | Disposition: A | Discharge status: Home / Self Care | Payer: Medicare PPO | Attending: Emergency Medicine | Admitting: Emergency Medicine

## 2019-07-20 DIAGNOSIS — R42 Dizziness and giddiness: Secondary | ICD-10-CM | POA: Insufficient documentation

## 2019-07-20 DIAGNOSIS — I2510 Atherosclerotic heart disease of native coronary artery without angina pectoris: Secondary | ICD-10-CM | POA: Diagnosis not present

## 2019-07-20 DIAGNOSIS — I509 Heart failure, unspecified: Secondary | ICD-10-CM | POA: Insufficient documentation

## 2019-07-20 DIAGNOSIS — Z7901 Long term (current) use of anticoagulants: Secondary | ICD-10-CM | POA: Insufficient documentation

## 2019-07-20 DIAGNOSIS — Z794 Long term (current) use of insulin: Secondary | ICD-10-CM | POA: Diagnosis not present

## 2019-07-20 DIAGNOSIS — Z7982 Long term (current) use of aspirin: Secondary | ICD-10-CM | POA: Diagnosis not present

## 2019-07-20 DIAGNOSIS — E119 Type 2 diabetes mellitus without complications: Secondary | ICD-10-CM | POA: Insufficient documentation

## 2019-07-20 DIAGNOSIS — J449 Chronic obstructive pulmonary disease, unspecified: Secondary | ICD-10-CM | POA: Diagnosis not present

## 2019-07-20 DIAGNOSIS — R0602 Shortness of breath: Secondary | ICD-10-CM

## 2019-07-20 DIAGNOSIS — Z87891 Personal history of nicotine dependence: Secondary | ICD-10-CM | POA: Insufficient documentation

## 2019-07-20 DIAGNOSIS — I110 Hypertensive heart disease with heart failure: Secondary | ICD-10-CM | POA: Insufficient documentation

## 2019-07-20 DIAGNOSIS — R0789 Other chest pain: Secondary | ICD-10-CM | POA: Insufficient documentation

## 2019-07-20 HISTORY — DX: Heart failure, unspecified: I50.9

## 2019-07-20 HISTORY — DX: Chronic obstructive pulmonary disease, unspecified: J44.9

## 2019-07-20 HISTORY — DX: Essential (primary) hypertension: I10

## 2019-07-20 HISTORY — DX: Other pulmonary embolism without acute cor pulmonale: I26.99

## 2019-07-20 HISTORY — DX: Type 2 diabetes mellitus without complications: E11.9

## 2019-07-20 HISTORY — DX: Atherosclerotic heart disease of native coronary artery without angina pectoris: I25.10

## 2019-07-20 LAB — BASIC METABOLIC PANEL
Anion gap: 7 (ref 5–15)
BUN: 15 mg/dL (ref 8–23)
CO2: 26 mmol/L (ref 22–32)
Calcium: 9 mg/dL (ref 8.9–10.3)
Chloride: 108 mmol/L (ref 98–111)
Creatinine, Ser: 1.16 mg/dL (ref 0.61–1.24)
GFR calc Af Amer: >60 mL/min (ref >60)
GFR calc non Af Amer: 60 mL/min — ABNORMAL LOW (ref >60)
Glucose, Bld: 109 mg/dL — ABNORMAL HIGH (ref 70–99)
Potassium: 4.2 mmol/L (ref 3.5–5.1)
Sodium: 141 mmol/L (ref 135–145)

## 2019-07-20 LAB — TROPONIN I (HIGH SENSITIVITY)
Troponin I (High Sensitivity): 16 ng/L (ref <18)
Troponin I (High Sensitivity): 14 ng/L (ref <18)

## 2019-07-20 LAB — CBC
HCT: 38 % — ABNORMAL LOW (ref 39.0–52.0)
Hemoglobin: 11.6 g/dL — ABNORMAL LOW (ref 13.0–17.0)
MCH: 26.8 pg (ref 26.0–34.0)
MCHC: 30.5 g/dL (ref 30.0–36.0)
MCV: 87.8 fL (ref 80.0–100.0)
Platelets: 229 10*3/uL (ref 150–400)
RBC: 4.33 MIL/uL (ref 4.22–5.81)
RDW: 13.6 % (ref 11.5–15.5)
WBC: 3.4 10*3/uL — ABNORMAL LOW (ref 4.0–10.5)
nRBC: 0 % (ref 0.0–0.2)

## 2019-07-20 LAB — BRAIN NATRIURETIC PEPTIDE: B Natriuretic Peptide: 28.8 pg/mL (ref 0.0–100.0)

## 2019-07-20 MED ORDER — SODIUM CHLORIDE 0.9% FLUSH: 3.0000 mL | Freq: Once | INTRAVENOUS | Status: DC

## 2019-07-20 NOTE — Discharge Instructions (Addendum)
As discussed, today's evaluation has been generally reassuring. There is no current evidence for worsening heart failure, heart disease, or pneumonia.  However, it is important he follow-up with either your physician or our home health services for appropriate ongoing management of your conditions. You can expect a phone call in the coming days from our colleagues to arrange home health services.

## 2019-07-20 NOTE — ED Triage Notes (Signed)
Pt is very hard of hearing. 

## 2019-07-20 NOTE — ED Triage Notes (Signed)
The pt came in by gems from home c/o chest pain for the past 3 days worse tonight pain worse with inspiration

## 2019-07-20 NOTE — ED Provider Notes (Signed)
[facility]   CSN: [id number] Arrival date & time: 07/20/19  0545     History Chief Complaint  Patient presents with  . Chest Pain    Payton MccallumDaniel Mazurek is a 79 y.o. male.  HPI    Patient presents with ongoing dyspnea, chest pain. Describes the pain is left upper thoracic, tight, worse with activity.  It seems to been present, worsening over the past few weeks, but today he had episode of dizziness.  This feature of dizziness with ambulation is new, possibly. He has baseline dyspnea that is otherwise unchanged. He denies any fever. He denies new fall. He is seemingly taking all medication as directed including Eliquis, noting history of DVT, bilaterally.  History is obtained by the patient, and per chart review, including review of pulmonology note with notation as below, from last week: DOE (dyspnea on exertion) Quit smoking 1996 - worse since covid 19 sept 2020 - 07/14/2019   Walked on RA x one lap =  approx 250 ft -@ relatively fast pace with cane stopped due to sob with sats still  99%      When respiratory symptoms begin or become refractory well after a patient reports complete smoking cessation,  Especially when this wasn't the case while they were smoking, a red flag is raised based on the work of Dr Primitivo GauzeFletcher which states:  if you quit smoking when your best day FEV1 is still well preserved it is highly unlikely you will progress to severe disease.   That is to say, once the smoking stops,  the symptoms should not suddenly erupt or markedly worsen.  If so, the differential diagnosis should include  obesity/deconditioning,  LPR/Reflux/Aspiration syndromes,  occult CHF, or  especially side effect of medications commonly used in this population.       Will hold off rx for copd as doubt he has much or that it is his limiting problem and note it will be a major challenge if he does have it in terms of teaching about inhaler  options given his hearing difficulty.        [facility] Date    Complicated by venous insuff > rec elevate/ wrap/ d/c nifedipine (see hbp) / continue eliquis    Essential hypertension Due to leg swelling/ difficulty urinating >>> D/c nifedipine  rx cardura 4 mg bid 07/14/2019 >>>    Past Medical History:  Diagnosis Date  . CHF (congestive heart failure) (HCC)   . COPD (chronic obstructive pulmonary disease) (HCC)   . Coronary artery disease   . Diabetes mellitus without complication (HCC)   . Hypertension   . Pulmonary embolism [facility](HCC)     Patient Active Problem List   Diagnosis Date Noted  . DOE (dyspnea on exertion) 07/14/2019  . Presence of IVC filter 07/14/2019  . 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 Central retinal vein occlusion with macular edema of right eye 07/07/2019  . Nuclear sclerotic cataract of both eyes 07/07/2019  . DIABETES MELLITUS, TYPE II 10/31/2006  . PERIPHERAL NEUROPATHY 10/31/2006  . Essential hypertension 10/31/2006  . SEIZURE DISORDER 10/31/2006    History reviewed. No pertinent surgical history.     No family history on file.  Social History   Tobacco Use  . Smoking status: Former Smoker    Packs/day: 1.00    Years: 40.00    Pack years: 40.00    Types: Cigarettes    Quit date: 01/01/1994  Years since quitting: 25.5  . Smokeless tobacco: Never Used  Substance Use Topics  . Alcohol use: Not Currently  . Drug use: Not on file    Home Medications Prior to Admission medications   Medication Sig Start Date End Date Taking? Authorizing Provider  aspirin EC 81 MG tablet Take 81 mg by mouth daily. Swallow whole.   Yes [provider]  atorvastatin (LIPITOR) 40 MG tablet Take 40 mg by mouth at bedtime.   Yes [provider]  Baclofen 5 MG TABS Take 5 mg by mouth in the morning, at noon, and at bedtime. X 14 days 06/02/19  Yes [provider]  ELIQUIS 5 MG TABS tablet Take 5 mg  by mouth 2 (two) times daily.  04/11/19  Yes [provider]  insulin NPH-regular Human (70-30) 100 UNIT/ML injection Inject 15-25 Units into the skin See admin instructions. Per sliding scale.   Yes [provider]  latanoprost (XALATAN) 0.005 % ophthalmic solution Place 1 drop into the left eye at bedtime. Patient advised that he gets a shot in his right eye and uses eye drops in his left. 06/01/19  Yes [provider]  losartan (COZAAR) 100 MG tablet Take 100 mg by mouth daily.  05/28/19  Yes [provider]  metFORMIN (GLUCOPHAGE) 1000 MG tablet Take 1,000 mg by mouth 2 (two) times daily with a meal.   Yes [provider]  metoprolol tartrate (LOPRESSOR) 50 MG tablet Take 50 mg by mouth 2 (two) times daily.  05/28/19  Yes [provider]  NIFEdipine (ADALAT CC) 90 MG 24 hr tablet Take 90 mg by mouth daily.  05/28/19  Yes [provider]  doxazosin (CARDURA) 4 MG tablet Take 1 tablet (4 mg total) by mouth 2 (two) times daily. 07/14/19   Nyoka Cowden, MD  Insulin Glargine (BASAGLAR KWIKPEN) 100 UNIT/ML Inject 29 Units into the skin at bedtime. Per RPh @@ [employer] - this has not been picked up yet.    [provider]    Allergies    Patient has no known allergies.  Review of Systems   Review of Systems  Constitutional:       Per HPI, otherwise negative  HENT:       Per HPI, otherwise negative  Respiratory:       Per HPI, otherwise negative  Cardiovascular:       Per HPI, otherwise negative  Gastrointestinal: Negative for vomiting.  Endocrine:       Negative aside from HPI  Genitourinary:       Neg aside from HPI   Musculoskeletal:       Per HPI, otherwise negative  Skin: Negative.   Neurological: Positive for dizziness. Negative for syncope.    Physical Exam Updated Vital Signs BP (!) 160/78   Pulse 81   Temp 98.5 F (36.9 C) (Oral)   Resp 18   Ht 5\' 11"  (1.803 m)   Wt 120.7 kg   SpO2 96%   BMI 37.11  kg/m   Physical Exam Vitals and nursing note reviewed.  Constitutional:      General: He is not in acute distress.    Appearance: He is well-developed.  HENT:     Head: Normocephalic and atraumatic.  Eyes:     Conjunctiva/sclera: Conjunctivae normal.  Cardiovascular:     Rate and Rhythm: Normal rate and regular rhythm.  Pulmonary:     Effort: Pulmonary effort is normal.     Breath sounds:  Decreased breath sounds present.  Chest:     Chest wall: Edema present.  Abdominal:     General: There is no distension.  Musculoskeletal:     Right lower leg: Edema present.     Left lower leg: Edema present.  Skin:    General: Skin is warm and dry.  Neurological:     Mental Status: He is alert and oriented to person, place, and time.     ED Results / Procedures / Treatments   Labs (all labs ordered are listed, but only abnormal results are displayed) Labs Reviewed  BASIC METABOLIC PANEL - Abnormal; Notable for the following components:      Result Value   Glucose, Bld 109 (*)    GFR calc non Af Amer 60 (*)    All other components within normal limits  CBC - Abnormal; Notable for the following components:   WBC 3.4 (*)    Hemoglobin 11.6 (*)    HCT 38.0 (*)    All other components within normal limits  BRAIN NATRIURETIC PEPTIDE  TROPONIN I (HIGH SENSITIVITY)  TROPONIN I (HIGH SENSITIVITY)    EKG EKG Interpretation  Date/Time:  Monday July 20 2019 05:55:09 EDT Ventricular Rate:  84 PR Interval:  160 QRS Duration: 168 QT Interval:  436 QTC Calculation: 515 R Axis:   -38 Text Interpretation: Normal sinus rhythm Left axis deviation Right bundle branch block Abnormal ECG Confirmed by Gerhard Munch [phone]) on 07/20/2019 10:09:50 AM   Radiology DG Chest 2 View  Result Date: 07/20/2019 CLINICAL DATA:  Chest pain for 3 days. EXAM: CHEST - 2 VIEW COMPARISON:  07/14/2019 FINDINGS: The cardiomediastinal silhouette is unchanged. Aortic atherosclerosis is noted. The lungs remain  hypoinflated with mild elevation of the left hemidiaphragm. There is bronchovascular crowding with similar appearance of asymmetric mild linear densities in the left mid and lower lung compatible with atelectasis or scarring. No segmental consolidation, sizeable pleural effusion, or pneumothorax is identified. No acute osseous abnormality is seen. IMPRESSION: Low lung volumes with mild left lung atelectasis or scarring. Electronically Signed   By: Sebastian Ache M.D.   On: 07/20/2019 06:15    Procedures Procedures (including critical care time)  Medications Ordered in ED Medications  sodium chloride flush (NS) 0.9 % injection 3 mL (has no administration in time range)    ED Course  I have reviewed the triage vital signs and the nursing notes.  Pertinent labs & imaging results that were available during my care of the patient were reviewed by me and considered in my medical decision making (see chart for details).    MDM Rules/Calculators/A&P                          1:29 PM Patient in no distress, resting, no oxygen requirement. Values reviewed, discussed, 2 normal troponin, unremarkable BNP value, labs without substantial electrode abnormalities, x-ray without evidence for pneumonia all reassuring for low suspicion of new ACS, decompensated heart failure, renal dysfunction, pneumonia. On review discharge clear the patient does have some ongoing intrinsic pulmonary disease, is appropriate for outpatient follow-up. I discussed his case with our hospital at home program who coordinates home health services, medications, follow-up. Patient amenable to the services, and will reach out to him in the coming days. Final Clinical Impression(s) / ED Diagnoses Final diagnoses:  Atypical chest pain  SOB (shortness of breath)   MDM Number of Diagnoses or Management Options Atypical chest pain: new, needed workup  SOB (shortness of breath): established, worsening   Amount and/or Complexity of  Data Reviewed Clinical lab tests: reviewed Tests in the radiology section of CPT: reviewed Tests in the medicine section of CPT: reviewed Decide to obtain previous medical records or to obtain history from someone other than the patient: yes Review and summarize past medical records: yes Discuss the patient with other providers: yes Independent visualization of images, tracings, or specimens: yes  Risk of Complications, Morbidity, and/or Mortality Presenting problems: high Diagnostic procedures: high Management options: high  Critical Care Total time providing critical care: < 30 minutes  Patient Progress Patient progress: stable    Gerhard Munch, MD 07/20/19 1349

## 2019-07-20 NOTE — ED Notes (Signed)
Patient verbalizes understanding of discharge instructions . Opportunity for questions and answers were provided . Armband removed by staff ,Pt discharged from ED. W/C  offered at D/C  and Declined W/C at D/C and was escorted to lobby by RN.  

## 2019-07-22 ENCOUNTER — Ambulatory Visit (INDEPENDENT_AMBULATORY_CARE_PROVIDER_SITE_OTHER): Payer: Medicare PPO | Admitting: Ophthalmology

## 2019-07-22 ENCOUNTER — Encounter (INDEPENDENT_AMBULATORY_CARE_PROVIDER_SITE_OTHER): Payer: Self-pay | Admitting: Ophthalmology

## 2019-07-22 ENCOUNTER — Other Ambulatory Visit: Payer: Self-pay

## 2019-07-22 DIAGNOSIS — H348111 Central retinal vein occlusion, right eye, with retinal neovascularization: Secondary | ICD-10-CM | POA: Diagnosis not present

## 2019-07-22 DIAGNOSIS — H4051X3 Glaucoma secondary to other eye disorders, right eye, severe stage: Secondary | ICD-10-CM | POA: Insufficient documentation

## 2019-07-22 NOTE — Progress Notes (Signed)
07/22/2019     CHIEF COMPLAINT Patient presents for Retina Follow Up   HISTORY OF PRESENT ILLNESS: Jason Joyce is a 79 y.o. male who presents to the clinic today for:   HPI    Retina Follow Up    Patient presents with  CRVO/BRVO.  In right eye.  Duration of 2 weeks.  Since onset it is stable.          Comments    2 week follow up status post injection intravitreal Avastin OD for neovascular disease from retinal vein occlusion.,  - Poss PRP OD Patient denies change in vision and overall has no complaints.  Plan is to halt neovascular stimulus on a permanent basis in the right eye.        Last edited by Edmon Crape, MD on 07/22/2019 10:39 AM. (History)      [place] [address] [place],  [place] [address]  HISTORICAL INFORMATION:   Selected notes from the MEDICAL RECORD NUMBER       CURRENT MEDICATIONS: Current Outpatient Medications (Ophthalmic Drugs)  Medication Sig  . latanoprost (XALATAN) 0.005 % ophthalmic solution Place 1 drop into the left eye at bedtime. Patient advised that he gets a shot in his right eye and uses eye drops in his left.   No current facility-administered medications for this visit. (Ophthalmic Drugs)   Current Outpatient Medications (Other)  Medication Sig  . aspirin EC 81 MG tablet Take 81 mg by mouth daily. Swallow whole.  Marland Kitchen atorvastatin (LIPITOR) 40 MG tablet Take 40 mg by mouth at bedtime.  . Baclofen 5 MG TABS Take 5 mg by mouth in the morning, at noon, and at bedtime. X 14 days  . doxazosin (CARDURA) 4 MG tablet Take 1 tablet (4 mg total) by mouth 2 (two) times daily.  Marland Kitchen ELIQUIS 5 MG TABS tablet Take 5 mg by mouth 2 (two) times daily.   . Insulin Glargine (BASAGLAR KWIKPEN) 100 UNIT/ML Inject 29 Units into the skin at bedtime. Per RPh @@ [employer] - this has not been picked up yet.  . insulin NPH-regular Human (70-30) 100 UNIT/ML injection Inject 15-25 Units into the skin  See admin instructions. Per sliding scale.  . losartan (COZAAR) 100 MG tablet Take 100 mg by mouth daily.   . metFORMIN (GLUCOPHAGE) 1000 MG tablet Take 1,000 mg by mouth 2 (two) times daily with a meal.  . metoprolol tartrate (LOPRESSOR) 50 MG tablet Take 50 mg by mouth 2 (two) times daily.   Marland Kitchen NIFEdipine (ADALAT CC) 90 MG 24 hr tablet Take 90 mg by mouth daily.    No current facility-administered medications for this visit. (Other)      REVIEW OF SYSTEMS:    ALLERGIES No Known Allergies  PAST MEDICAL HISTORY Past Medical History:  Diagnosis Date  . CHF (congestive heart failure) (HCC)   . COPD (chronic obstructive pulmonary disease) (HCC)   . Coronary artery disease   . Diabetes mellitus without complication (HCC)   . Hypertension   . Pulmonary embolism (HCC)    History reviewed. No pertinent surgical history.  FAMILY HISTORY History reviewed. No pertinent family history.  SOCIAL HISTORY Social History   Tobacco Use  . Smoking status: Former Smoker    Packs/day: 1.00    Years: 40.00    Pack years: 40.00    Types: Cigarettes    Quit date: 01/01/1994    Years since quitting: 25.5  .  Smokeless tobacco: Never Used  Substance Use Topics  . Alcohol use: Not Currently  . Drug use: Not on file         OPHTHALMIC EXAM:  Base Eye Exam    Visual Acuity (Snellen - Linear)      Right Left   Dist [place] HM 20/30-2   Dist ph [place] NI        Tonometry (Tonopen, 10:09 AM)      Right Left   Pressure 29 15       Pupils      Pupils Dark Light Shape React APD   Right PERRL 4 3 Round Brisk +1   Left PERRL 4 3 Round Brisk None       Visual Fields (Counting fingers)      Left Right    Full    Restrictions  Total superior temporal, inferior temporal, superior nasal, inferior nasal deficiencies       Extraocular Movement      Right Left    Full Full       Neuro/Psych    Oriented x3: Yes   Mood/Affect: Normal       Dilation    Right eye: 1.0% Mydriacyl, 2.5%  Phenylephrine @ 10:13 AM        Slit Lamp and Fundus Exam    External Exam      Right Left   External Normal Normal       Slit Lamp Exam      Right Left   Lids/Lashes Normal Normal   Conjunctiva/Sclera White and quiet White and quiet   Cornea Clear Clear   Anterior Chamber Deep and quiet Deep and quiet   Iris Round and reactive Round and reactive   Vitreous Normal Normal          IMAGING AND PROCEDURES  Imaging and Procedures for 07/22/19  Repair Retinal Detach, Photocoag - OD - Right Eye       Time Out Confirmed correct patient, procedure, site, and patient consented.   Anesthesia Topical anesthesia was used. Anesthetic medications included Proparacaine 0.5%.   Laser Information The type of laser was diode. Color was yellow. The duration in seconds was 0.05. The spot size was 390 microns. Laser power was 340. Total spots was 1074.   Post-op The patient tolerated the procedure well. There were no complications. The patient received written and verbal post procedure care education.   Notes PRP applied inferonasal and temporal.                ASSESSMENT/PLAN:  No problem-specific Assessment & Plan notes found for this encounter.      ICD-10-CM   1. 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 Neovascular glaucoma, right eye, severe stage  H40.51X3     1.  PRP OD today, after intravitreal Avastin to diminish neovascular disease on a permanent basis.  Goal is to prevent vision loss and global loss  2.  Return visit in 2 weeks for PRP #2.  3.  Ophthalmic Meds Ordered this visit:  No orders of the defined types were placed in this encounter.      Return in about 2 weeks (around 08/05/2019) for dilate, OD, PRP.  There are no Patient Instructions on file for this visit.   Explained the diagnoses, plan, and follow up with the patient and they expressed  understanding.  Patient expressed understanding of the importance  of proper follow up care.   Alford Highland Deshara Rossi M.D. Diseases & Surgery of the Retina and Vitreous [facility] 07/22/19     Abbreviations: M myopia (nearsighted); A astigmatism; H hyperopia (farsighted); P presbyopia; Mrx spectacle prescription;  CTL contact lenses; OD right eye; OS left eye; OU both eyes  XT exotropia; ET esotropia; PEK punctate epithelial keratitis; PEE punctate epithelial erosions; DES dry eye syndrome; MGD meibomian gland dysfunction; ATs artificial tears; PFAT's preservative free artificial tears; NSC nuclear sclerotic cataract; PSC posterior subcapsular cataract; ERM epi-retinal membrane; PVD posterior vitreous detachment; RD retinal detachment; DM diabetes mellitus; DR diabetic retinopathy; NPDR non-proliferative diabetic retinopathy; PDR proliferative diabetic retinopathy; CSME clinically significant macular edema; DME diabetic macular edema; dbh dot blot hemorrhages; CWS cotton wool spot; POAG primary open angle glaucoma; C/D cup-to-disc ratio; HVF humphrey visual field; GVF goldmann visual field; OCT optical coherence tomography; IOP intraocular pressure; BRVO Branch retinal vein occlusion; CRVO central retinal vein occlusion; CRAO central retinal artery occlusion; BRAO branch retinal artery occlusion; RT retinal tear; SB scleral buckle; PPV pars plana vitrectomy; VH Vitreous hemorrhage; PRP panretinal laser photocoagulation; IVK intravitreal kenalog; VMT vitreomacular traction; MH Macular hole;  NVD neovascularization of the disc; NVE neovascularization elsewhere; AREDS age related eye disease study; ARMD age related macular degeneration; POAG primary open angle glaucoma; EBMD epithelial/anterior basement membrane dystrophy; ACIOL anterior chamber intraocular lens; IOL intraocular lens; PCIOL posterior chamber intraocular lens; Phaco/IOL phacoemulsification with intraocular lens placement; PRK  photorefractive keratectomy; LASIK laser assisted in situ keratomileusis; HTN hypertension; DM diabetes mellitus; COPD chronic obstructive pulmonary disease

## 2019-07-27 ENCOUNTER — Telehealth: Payer: Self-pay | Admitting: Cardiovascular Disease

## 2019-07-27 NOTE — Telephone Encounter (Signed)
Patient was previously scheduled for Dr. Kirke Corin on 07/28/19 at 8:20 am. Rescheduled to 07/28/19 at 8:00 am with Dr. Flora Lipps due to Dr. Kirke Corin only takes vascular patients.

## 2019-07-28 ENCOUNTER — Encounter: Payer: Self-pay | Admitting: Cardiovascular Disease

## 2019-07-28 ENCOUNTER — Ambulatory Visit: Payer: Medicare PPO | Admitting: Cardiovascular Disease

## 2019-07-28 ENCOUNTER — Ambulatory Visit (INDEPENDENT_AMBULATORY_CARE_PROVIDER_SITE_OTHER): Payer: Medicare PPO | Admitting: Cardiovascular Disease

## 2019-07-28 ENCOUNTER — Other Ambulatory Visit: Payer: Self-pay

## 2019-07-28 VITALS — BP 150/80 | HR 74 | Ht 71.5 in | Wt 265.6 lb

## 2019-07-28 DIAGNOSIS — E782 Mixed hyperlipidemia: Secondary | ICD-10-CM | POA: Diagnosis not present

## 2019-07-28 DIAGNOSIS — I739 Peripheral vascular disease, unspecified: Secondary | ICD-10-CM

## 2019-07-28 DIAGNOSIS — I10 Essential (primary) hypertension: Secondary | ICD-10-CM | POA: Diagnosis not present

## 2019-07-28 DIAGNOSIS — R600 Localized edema: Secondary | ICD-10-CM

## 2019-07-28 DIAGNOSIS — R079 Chest pain, unspecified: Secondary | ICD-10-CM | POA: Diagnosis not present

## 2019-07-28 MED ORDER — FUROSEMIDE 40 MG PO TABS: 40.0000 mg | ORAL_TABLET | Freq: Every day | ORAL | 3 refills | Status: AC

## 2019-07-28 NOTE — Progress Notes (Signed)
Cardiology Office Note:   Date:  07/28/2019  NAME:  Jason Joyce    MRN: [id number] DOB:  November 21, 1940   PCP:  Lynden Ang, DO  Cardiologist:  No primary care provider on file.   Referring MD: Karl Ito, DO   Chief Complaint  Patient presents with  . Chest Pain   History of Present Illness:   Jason Joyce is a 79 y.o. male with a hx of DM, HTN, PAD, COPD who is being seen today for the evaluation of chest pain at the request of Lynden Ang, DO.   History of uncontrolled DM with A1c 14 in [place]. Was evaluated by cardiology in the past who recommended LHC but not done due to Covid 19. Noted to have severe PAD in [place] as well. In the ER 7/19 for chest pain. Troponin negative x 2. BNP 28.   He reports has had intermittent episodes of chest pain for 1 year.  The chest pain is described as sharp left-sided chest pain that can go into his jaw with lying down.  He reports that movement and getting up and walking actually helps this.  He reports no symptoms of leg pain.  He reports intermittent episodes of sharp pain as well in his legs with laying down.  It appears that getting up helps this.  He also reports that he gets quite short of breath with doing minimal activity.  He is not that active.  He reports that he cannot walk due to blood clots in his legs.  He lives in an apartment here in [place].  Apparently recently relocated to the area.  His history is significant for uncontrolled diabetes.  Most recent A1c in [place] was around 10.  I have an A1c in care everywhere in October 2020 was 6.8.  His blood pressure is uncontrolled today.  He reports he takes his medications infrequently.  He reports he does have COPD.  He quit smoking a number of years ago.  He has a 40-pack-year history.  There is mention of atrial fibrillation however he is in normal sinus rhythm today.  No recent echocardiogram and no recent stress test in our system.  There is also mention of severe PAD  from [facility].  However he has never followed up with this.  He describes no claudication symptoms today.  Problem List 1. DM -A1c 10.5 -T chol 118, TG 74, HDL 48, LDL 55 2. DVT with IVC filter 3. PAD -ABI rt 0.20; L foot 0.19 (07/2018 [place]) 4. COPD  Past Medical History: Past Medical History:  Diagnosis Date  . CHF (congestive heart failure) (HCC)   . COPD (chronic obstructive pulmonary disease) (HCC)   . Coronary artery disease   . Diabetes mellitus without complication (HCC)   . Hypertension   . Pulmonary embolism [facility])     Past Surgical History: Past Surgical History:  Procedure Laterality Date  . IVC FILTER INSERTION      Current Medications: Current Meds  Medication Sig  . aspirin EC 81 MG tablet Take 81 mg by mouth daily. Swallow whole.  Marland Kitchen atorvastatin (LIPITOR) 40 MG tablet Take 40 mg by mouth at bedtime.  . Baclofen 5 MG TABS Take 5 mg by mouth in the morning, at noon, and at bedtime. X 14 days  . doxazosin (CARDURA) 4 MG tablet Take 1 tablet (4 mg total) by mouth 2 (two) times daily.  Marland Kitchen ELIQUIS 5 MG TABS tablet Take 5 mg by mouth 2 (two) times  daily.   Marland Kitchen HYDROcodone-acetaminophen (NORCO/VICODIN) 5-325 MG tablet Take 1 tablet by mouth in the morning and at bedtime.  . Insulin Glargine (BASAGLAR KWIKPEN) 100 UNIT/ML Inject 29 Units into the skin at bedtime. Per RPh @@ [employer] - this has not been picked up yet.  . insulin NPH-regular Human (70-30) 100 UNIT/ML injection Inject 15-25 Units into the skin See admin instructions. Per sliding scale.  . latanoprost (XALATAN) 0.005 % ophthalmic solution Place 1 drop into the left eye at bedtime. Patient advised that he gets a shot in his right eye and uses eye drops in his left.  Marland Kitchen losartan (COZAAR) 100 MG tablet Take 100 mg by mouth daily.   . metFORMIN (GLUCOPHAGE) 1000 MG tablet Take 1,000 mg by mouth 2 (two) times daily with a meal.  . metoprolol tartrate (LOPRESSOR) 50 MG tablet Take 50 mg by mouth 2 (two) times daily.    Marland Kitchen NIFEdipine (ADALAT CC) 90 MG 24 hr tablet Take 90 mg by mouth daily.      Allergies:    Patient has no known allergies.   Social History: Social History   Socioeconomic History  . Marital status: Single    Spouse name: Not on file  . Number of children: Not on file  . Years of education: Not on file  . Highest education level: Not on file  Occupational History  . Not on file  Tobacco Use  . Smoking status: Former Smoker    Packs/day: 1.00    Years: 40.00    Pack years: 40.00    Types: Cigarettes    Quit date: 01/01/1994    Years since quitting: 25.5  . Smokeless tobacco: Never Used  Substance and Sexual Activity  . Alcohol use: Not Currently  . Drug use: Never  . Sexual activity: Not on file  Other Topics Concern  . Not on file  Social History Narrative  . Not on file   Social Determinants of Health   Financial Resource Strain:   . Difficulty of Paying Living Expenses:   Food Insecurity:   . Worried About Programme researcher, broadcasting/film/video in the Last Year:   . Barista in the Last Year:   Transportation Needs:   . Freight forwarder (Medical):   Marland Kitchen Lack of Transportation (Non-Medical):   Physical Activity:   . Days of Exercise per Week:   . Minutes of Exercise per Session:   Stress:   . Feeling of Stress :   Social Connections:   . Frequency of Communication with Friends and Family:   . Frequency of Social Gatherings with Friends and Family:   . Attends Religious Services:   . Active Member of Clubs or Organizations:   . Attends Banker Meetings:   Marland Kitchen Marital Status:      Family History: The patient's family history includes Diabetes in his mother; Heart disease in his mother; Kidney disease in his mother.  ROS:   All other ROS reviewed and negative. Pertinent positives noted in the HPI.     EKGs/Labs/Other Studies Reviewed:   The following studies were personally reviewed by me today:  EKG:  EKG is ordered today.  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   Recent Labs: 04/12/2019: ALT 17 07/20/2019: B Natriuretic Peptide 28.8; BUN 15; Creatinine, Ser 1.16; Hemoglobin 11.6; Platelets  229; Potassium 4.2; Sodium 141   Recent Lipid Panel No results found for: CHOL, TRIG, HDL, CHOLHDL, VLDL, LDLCALC, LDLDIRECT  Physical Exam:   VS:  BP (!) 150/80   Pulse 74   Ht 5' 11.5" (1.816 m)   Wt (!) 265 lb 9.6 oz (120.5 kg)   BMI 36.53 kg/m    Wt Readings from Last 3 Encounters:  07/28/19 (!) 265 lb 9.6 oz (120.5 kg)  07/20/19 266 lb 1.5 oz (120.7 kg)  07/14/19 266 lb (120.7 kg)    General: Well nourished, well developed, in no acute distress Heart: Atraumatic, normal size  Eyes: PEERLA, EOMI  Neck: Supple, no JVD Endocrine: No thryomegaly Cardiac: Normal S1, S2; RRR; no murmurs, rubs, or gallops Lungs: Clear to auscultation bilaterally, no wheezing, rhonchi or rales  Abd: Soft, nontender, no hepatomegaly  Ext: 1+ lower extremity edema, absent pulses in the lower extremities Musculoskeletal: No deformities, BUE and BLE strength normal and equal Skin: Warm and dry, no rashes   Neuro: Alert and oriented to person, place, time, and situation, CNII-XII grossly intact, no focal deficits  Psych: Normal mood and affect   ASSESSMENT:   Jason Joyce is a 79 y.o. male who presents for the following: 1. Chest pain, unspecified type   2. Mixed hyperlipidemia   3. Leg edema   4. Essential hypertension   5. PAD (peripheral artery disease) (HCC)     PLAN:   1. Chest pain, unspecified type -Atypical chest pain.  Recent emergency room visit which was negative for an acute coronary syndrome.  His EKG today demonstrates normal sinus rhythm with no acute ST-T changes.  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  I recommended nuclear medicine stress  test for further evaluation.  We also will obtain echocardiogram as a suspect he might have heart failure with preserved ejection fraction.  2. Mixed hyperlipidemia -He is diabetic.  He has known PAD.  His most recent LDL cholesterol is less than 70.  Continue Lipitor 40 mg daily.  Continue aspirin 81 mg daily.  3. Leg edema -He has lower extremity edema.  A recent BNP value was normal.  This could be related to immobility.  He has no significant CKD.  [facility] however he is not had any further evaluation of this.  We will check an echocardiogram.  I recommended he start Lasix 40 mg daily.  He apparently has frequent symptoms of urinary retention and frequent visits to the restroom.  He should see a urologist.  4. Essential hypertension -Blood pressure not controlled today.  We will start Lasix 40 mg daily.  He will continue metoprolol tartrate 50 mg twice daily, nifedipine 90 mg daily, losartan 1 mg daily.  He reports that he takes his medications infrequently so I suspect some of this is noncompliance.  5. PAD (peripheral artery disease) (HCC) -There is [facility].  He does not describe classic claudication symptoms.  There is no ulcers.  He is quite immobile.  For now we will continue to monitor this.  We will consider vascular ultrasounds if he has symptoms that are bothersome to him.  Continue aspirin and statin.   Disposition: Return in about 1 month (around 08/28/2019).  Medication Adjustments/Labs and Tests Ordered: Current medicines are reviewed at length with the patient today.  Concerns regarding medicines are outlined above.  Orders Placed This Encounter  Procedures  . MYOCARDIAL PERFUSION IMAGING  . EKG 12-Lead  . ECHOCARDIOGRAM COMPLETE   Meds ordered this encounter  Medications  . furosemide (LASIX) 40 MG tablet    Sig: Take 1 tablet (40 mg total) by mouth daily.    Dispense:  90  tablet    Refill:  3    Patient Instructions  Medication Instructions:  Start Lasix 40 mg in the morning.  *If you need a refill on your cardiac medications before your next appointment, please call your pharmacy*   Testing/Procedures: Echocardiogram - Your physician has requested that you have an echocardiogram. Echocardiography is a painless test that uses sound waves to create images of your heart. It provides your doctor with information about the size and shape of your heart and how well your heart's chambers and valves are working. This procedure takes approximately one hour. There are no restrictions for this procedure. This will be performed at our [facility] location - [address].  Your physician has requested that you have a Scientist, physiological ([address]). A cardiac stress test is a cardiological test that measures the heart's ability to respond to external stress in a controlled clinical environment. The stress response is induced by intravenous pharmacological stimulation.     Follow-Up: At [facility], you and your health needs are our priority.  As part of our continuing mission to provide you with exceptional heart care, we have created designated Provider Care Teams.  These Care Teams include your primary Cardiologist (physician) and Advanced Practice Providers (APPs -  Physician Assistants and Nurse Practitioners) who all work together to provide you with the care you need, when you need it.  We recommend signing up for the patient portal called "MyChart".  Sign up information is provided on this After Visit Summary.  MyChart is used to connect with patients for Virtual Visits (Telemedicine).  Patients are able to view lab/test results, encounter notes, upcoming appointments, etc.  Non-urgent messages can be sent to your provider as well.   To learn more about what you can do with MyChart, go to [email or web address].    Your next appointment:   1  month(s)  The format for your next appointment:   In Person  Provider:   Lennie Odor, MD        Signed, Lenna Gilford. Flora Lipps, MD [facility]  [address] [place], [place] [address] [phone]  07/28/2019 9:06 AM

## 2019-07-28 NOTE — Patient Instructions (Signed)
Medication Instructions:  Start Lasix 40 mg in the morning.  *If you need a refill on your cardiac medications before your next appointment, please call your pharmacy*   Testing/Procedures: Echocardiogram - Your physician has requested that you have an echocardiogram. Echocardiography is a painless test that uses sound waves to create images of your heart. It provides your doctor with information about the size and shape of your heart and how well your heart's chambers and valves are working. This procedure takes approximately one hour. There are no restrictions for this procedure. This will be performed at our [facility] location - [address].  Your physician has requested that you have a Scientist, physiological ([address]). A cardiac stress test is a cardiological test that measures the heart's ability to respond to external stress in a controlled clinical environment. The stress response is induced by intravenous pharmacological stimulation.     Follow-Up: At [facility], you and your health needs are our priority.  As part of our continuing mission to provide you with exceptional heart care, we have created designated Provider Care Teams.  These Care Teams include your primary Cardiologist (physician) and Advanced Practice Providers (APPs -  Physician Assistants and Nurse Practitioners) who all work together to provide you with the care you need, when you need it.  We recommend signing up for the patient portal called "MyChart".  Sign up information is provided on this After Visit Summary.  MyChart is used to connect with patients for Virtual Visits (Telemedicine).  Patients are able to view lab/test results, encounter notes, upcoming appointments, etc.  Non-urgent messages can be sent to your provider as well.   To learn more about what you can do with MyChart, go to [email or web address].    Your next appointment:   1 month(s)  The format for your next appointment:   In  Person  Provider:   Lennie Odor, MD

## 2019-08-05 ENCOUNTER — Encounter: Payer: Self-pay | Admitting: Urology

## 2019-08-05 ENCOUNTER — Encounter (INDEPENDENT_AMBULATORY_CARE_PROVIDER_SITE_OTHER): Payer: Medicare PPO | Admitting: Ophthalmology

## 2019-08-10 ENCOUNTER — Telehealth (HOSPITAL_COMMUNITY): Payer: Self-pay | Admitting: *Deleted

## 2019-08-10 NOTE — Telephone Encounter (Signed)
Left message on voicemail in reference to upcoming appointment scheduled for 08/13/19. Phone number given for a call back so details instructions can be given. Jason Joyce

## 2019-08-11 ENCOUNTER — Ambulatory Visit: Payer: Medicare PPO | Admitting: Endocrinology

## 2019-08-11 ENCOUNTER — Encounter (INDEPENDENT_AMBULATORY_CARE_PROVIDER_SITE_OTHER): Payer: Medicare PPO | Admitting: Ophthalmology

## 2019-08-12 ENCOUNTER — Ambulatory Visit (INDEPENDENT_AMBULATORY_CARE_PROVIDER_SITE_OTHER): Payer: Medicare PPO | Admitting: Ophthalmology

## 2019-08-12 ENCOUNTER — Telehealth (HOSPITAL_COMMUNITY): Payer: Self-pay | Admitting: *Deleted

## 2019-08-12 ENCOUNTER — Other Ambulatory Visit: Payer: Self-pay

## 2019-08-12 ENCOUNTER — Encounter (INDEPENDENT_AMBULATORY_CARE_PROVIDER_SITE_OTHER): Payer: Self-pay | Admitting: Ophthalmology

## 2019-08-12 DIAGNOSIS — H348111 Central retinal vein occlusion, right eye, with retinal neovascularization: Secondary | ICD-10-CM

## 2019-08-12 DIAGNOSIS — H34811 Central retinal vein occlusion, right eye, with macular edema: Secondary | ICD-10-CM | POA: Diagnosis not present

## 2019-08-12 DIAGNOSIS — H4051X3 Glaucoma secondary to other eye disorders, right eye, severe stage: Secondary | ICD-10-CM

## 2019-08-12 MED ORDER — BEVACIZUMAB CHEMO INJECTION 1.25MG/0.05ML SYRINGE FOR KALEIDOSCOPE
1.2500 mg | INTRAVITREAL | Status: AC | PRN
  Administered 2019-08-12: 1.25 mg via INTRAVITREAL

## 2019-08-12 NOTE — Progress Notes (Signed)
08/12/2019     CHIEF COMPLAINT Patient presents for Retina Follow Up   HISTORY OF PRESENT ILLNESS: Jason Joyce is a 79 y.o. male who presents to the clinic today for:   HPI    Retina Follow Up    Patient presents with  CRVO/BRVO.  In right eye.  Duration of 4.  Since onset it is stable.  I, the attending physician,  performed the HPI with the patient and updated documentation appropriately.          Comments    4 week fu - OCT OU, Poss Avastin OD Patient states his vision may be a little worse. No other complaints.  OD neovascular glaucoma has improved with baseline intraocular pressure 32 now down to 23 status post intraocular injection Avastin followed by PRP.  Injection Avastin OD today        Last edited by Edmon Crape, MD on 08/12/2019 11:29 AM. (History)      [place] [address] [place],  [place] [address]  HISTORICAL INFORMATION:   Selected notes from the MEDICAL RECORD NUMBER       CURRENT MEDICATIONS: Current Outpatient Medications (Ophthalmic Drugs)  Medication Sig  . latanoprost (XALATAN) 0.005 % ophthalmic solution Place 1 drop into the left eye at bedtime. Patient advised that he gets a shot in his right eye and uses eye drops in his left.   No current facility-administered medications for this visit. (Ophthalmic Drugs)   Current Outpatient Medications (Other)  Medication Sig  . aspirin EC 81 MG tablet Take 81 mg by mouth daily. Swallow whole.  Marland Kitchen atorvastatin (LIPITOR) 40 MG tablet Take 40 mg by mouth at bedtime.  . Baclofen 5 MG TABS Take 5 mg by mouth in the morning, at noon, and at bedtime. X 14 days  . doxazosin (CARDURA) 4 MG tablet Take 1 tablet (4 mg total) by mouth 2 (two) times daily.  Marland Kitchen ELIQUIS 5 MG TABS tablet Take 5 mg by mouth 2 (two) times daily.   . furosemide (LASIX) 40 MG tablet Take 1 tablet (40 mg total) by mouth daily.  Marland Kitchen HYDROcodone-acetaminophen (NORCO/VICODIN) 5-325  MG tablet Take 1 tablet by mouth in the morning and at bedtime.  . Insulin Glargine (BASAGLAR KWIKPEN) 100 UNIT/ML Inject 29 Units into the skin at bedtime. Per RPh @@ [facility] - this has not been picked up yet.  . insulin NPH-regular Human (70-30) 100 UNIT/ML injection Inject 15-25 Units into the skin See admin instructions. Per sliding scale.  . losartan (COZAAR) 100 MG tablet Take 100 mg by mouth daily.   . metFORMIN (GLUCOPHAGE) 1000 MG tablet Take 1,000 mg by mouth 2 (two) times daily with a meal.  . metoprolol tartrate (LOPRESSOR) 50 MG tablet Take 50 mg by mouth 2 (two) times daily.   Marland Kitchen NIFEdipine (ADALAT CC) 90 MG 24 hr tablet Take 90 mg by mouth daily.    No current facility-administered medications for this visit. (Other)      REVIEW OF SYSTEMS:    ALLERGIES No Known Allergies  PAST MEDICAL HISTORY Past Medical History:  Diagnosis Date  . CHF (congestive heart failure) (HCC)   . COPD (chronic obstructive pulmonary disease) (HCC)   . Coronary artery disease   . Diabetes mellitus without complication (HCC)   . Hypertension   . Pulmonary embolism [facility])    Past Surgical History:  Procedure Laterality Date  . IVC FILTER INSERTION    .  PENILE PROSTHESIS IMPLANT  about 2010    FAMILY HISTORY Family History  Problem Relation Age of Onset  . Diabetes Mother   . Heart disease Mother   . Kidney disease Mother     SOCIAL HISTORY Social History   Tobacco Use  . Smoking status: Former Smoker    Packs/day: 1.00    Years: 40.00    Pack years: 40.00    Types: Cigarettes    Quit date: 01/01/1994    Years since quitting: 25.6  . Smokeless tobacco: Never Used  Substance Use Topics  . Alcohol use: Not Currently  . Drug use: Never         OPHTHALMIC EXAM:  Base Eye Exam    Visual Acuity (Snellen - Linear)      Right Left   Dist [place] HM 20/40-2   Dist ph [place] NI NI       Tonometry (Tonopen, 11:06 AM)      Right Left   Pressure 23 12       Pupils      Pupils  Dark Light Shape React APD   Right PERRL 4 3 Round Brisk +1   Left PERRL 4 3 Round Brisk None       Visual Fields (Counting fingers)      Left Right    Full    Restrictions  Total superior temporal, inferior temporal, superior nasal, inferior nasal deficiencies       Extraocular Movement      Right Left    Full Full       Neuro/Psych    Oriented x3: Yes   Mood/Affect: Normal       Dilation    Right eye: 1.0% Mydriacyl, 2.5% Phenylephrine @ 11:06 AM        Slit Lamp and Fundus Exam    External Exam      Right Left   External Normal Normal       Slit Lamp Exam      Right Left   Lids/Lashes Normal Normal   Conjunctiva/Sclera White and quiet White and quiet   Cornea Clear Clear   Anterior Chamber Deep and quiet Deep and quiet   Iris Round and reactive Round and reactive   Lens 3+ Nuclear sclerosis    Anterior Vitreous Normal Normal       Fundus Exam      Right Left   Posterior Vitreous Normal    Disc Collaterals on the nerve, Early    C/D Ratio 0.75    Macula Cystoid macular edema, Intraretinal hemorrhage, Microaneurysms    Vessels Diffuse ischemic central retinal vein occlusion    Periphery Good PRP, room for more scatter treatment peripherally           IMAGING AND PROCEDURES  Imaging and Procedures for 08/12/19  OCT, Retina - OU - Both Eyes       Right Eye Quality was good. Scan locations included subfoveal. Central Foveal Thickness: 331. Findings include cystoid macular edema.   Left Eye Quality was good. Scan locations included subfoveal. Central Foveal Thickness: 248. Progression has been stable. Findings include no SRF, no IRF, vitreomacular adhesion .   Notes Vastly improved CME OD from central retinal vein occlusion post intravitreal Avastin No. 1, and PRP peripherally       Intravitreal Injection, Pharmacologic Agent - OD - Right Eye       Time Out 08/12/2019. 11:35 AM. Confirmed correct patient, procedure, site, and patient  consented.  Anesthesia Topical anesthesia was used. Anesthetic medications included Akten 3.5%.   Procedure Preparation included Ofloxacin , 5% betadine to ocular surface, 10% betadine to eyelids. A 30 gauge needle was used.   Injection:  1.25 mg Bevacizumab (AVASTIN) SOLN   NDC: [address]-3, Lot: [id number]   Route: Intravitreal, Site: Right Eye, Waste: 0 mg  Post-op Post injection exam found visual acuity of at least counting fingers. The patient tolerated the procedure well. There were no complications. The patient received written and verbal post procedure care education. Post injection medications were not given.                 ASSESSMENT/PLAN:  Neovascular glaucoma, right eye, severe stage This condition is improving with much less intraocular pressure elevation.  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 Neovascular glaucoma, right eye, severe stage  H40.51X3     1.  2.  3.  Ophthalmic Meds Ordered this visit:  Meds ordered this encounter  Medications  . Bevacizumab (AVASTIN) SOLN 1.25 mg       Return in about 5 weeks (around 09/16/2019) for AVASTIN OCT, OD.  There are no Patient Instructions on file for this visit.   Explained the diagnoses, plan, and follow up with the patient and they expressed understanding.  Patient expressed understanding of the importance of proper follow up care.   Alford Highland Katreena Schupp M.D. Diseases & Surgery of the Retina and Vitreous [facility] 08/12/19     Abbreviations: M myopia  (nearsighted); A astigmatism; H hyperopia (farsighted); P presbyopia; Mrx spectacle prescription;  CTL contact lenses; OD right eye; OS left eye; OU both eyes  XT exotropia; ET esotropia; PEK punctate epithelial keratitis; PEE punctate epithelial erosions; DES dry eye syndrome; MGD meibomian gland dysfunction; ATs artificial tears; PFAT's preservative free artificial tears; NSC nuclear sclerotic cataract; PSC posterior subcapsular cataract; ERM epi-retinal membrane; PVD posterior vitreous detachment; RD retinal detachment; DM diabetes mellitus; DR diabetic retinopathy; NPDR non-proliferative diabetic retinopathy; PDR proliferative diabetic retinopathy; CSME clinically significant macular edema; DME diabetic macular edema; dbh dot blot hemorrhages; CWS cotton wool spot; POAG primary open angle glaucoma; C/D cup-to-disc ratio; HVF humphrey visual field; GVF goldmann visual field; OCT optical coherence tomography; IOP intraocular pressure; BRVO Branch retinal vein occlusion; CRVO central retinal vein occlusion; CRAO central retinal artery occlusion; BRAO branch retinal artery occlusion; RT retinal tear; SB scleral buckle; PPV pars plana vitrectomy; VH Vitreous hemorrhage; PRP panretinal laser photocoagulation; IVK intravitreal kenalog; VMT vitreomacular traction; MH Macular hole;  NVD neovascularization of the disc; NVE neovascularization elsewhere; AREDS age related eye disease study; ARMD age related macular degeneration; POAG primary open angle glaucoma; EBMD epithelial/anterior basement membrane dystrophy; ACIOL anterior chamber intraocular lens; IOL intraocular lens; PCIOL posterior chamber intraocular lens; Phaco/IOL phacoemulsification with intraocular lens placement; PRK photorefractive keratectomy; LASIK laser assisted in situ keratomileusis; HTN hypertension; DM diabetes mellitus; COPD chronic obstructive pulmonary disease

## 2019-08-12 NOTE — Assessment & Plan Note (Signed)
Macular edema also improved status post Avastin will repeat injection today to maintain

## 2019-08-12 NOTE — Telephone Encounter (Signed)
Patient and Patients daughter Gwenith Daily was given detailed instructions per Myocardial Perfusion Study Information Sheet for the test on 08/12/2019 at 1030. Patient notified to arrive 15 minutes early and that it is imperative to arrive on time for appointment to keep from having the test rescheduled.  If you need to cancel or reschedule your appointment, please call the office within 24 hours of your appointment. . Patient verbalized understanding.Sanah Kraska, Adelene Idler

## 2019-08-12 NOTE — Assessment & Plan Note (Signed)
This condition is improving with much less intraocular pressure elevation.

## 2019-08-13 ENCOUNTER — Ambulatory Visit (HOSPITAL_BASED_OUTPATIENT_CLINIC_OR_DEPARTMENT_OTHER): Payer: Medicare PPO

## 2019-08-13 ENCOUNTER — Ambulatory Visit (HOSPITAL_COMMUNITY): Payer: Medicare PPO | Attending: Cardiology

## 2019-08-13 DIAGNOSIS — R079 Chest pain, unspecified: Secondary | ICD-10-CM

## 2019-08-13 LAB — ECHOCARDIOGRAM COMPLETE
Area-P 1/2: 2.6 cm2
S' Lateral: 3.4 cm

## 2019-08-13 LAB — MYOCARDIAL PERFUSION IMAGING
LV dias vol: 101 mL (ref 62–150)
LV sys vol: 41 mL
Peak HR: 78 {beats}/min
Rest HR: 60 {beats}/min
SDS: 0
SRS: 0
SSS: 0
TID: 0.98

## 2019-08-13 MED ORDER — REGADENOSON 0.4 MG/5ML IV SOLN
0.4000 mg | Freq: Once | INTRAVENOUS | Status: AC
  Administered 2019-08-13: 0.4 mg via INTRAVENOUS

## 2019-08-13 MED ORDER — TECHNETIUM TC 99M TETROFOSMIN IV KIT
9.9000 | PACK | Freq: Once | INTRAVENOUS | Status: AC | PRN
  Administered 2019-08-13: 9.9 via INTRAVENOUS
  Filled 2019-08-13: qty 10

## 2019-08-13 MED ORDER — TECHNETIUM TC 99M TETROFOSMIN IV KIT
31.2000 | PACK | Freq: Once | INTRAVENOUS | Status: AC | PRN
  Administered 2019-08-13: 31.2 via INTRAVENOUS
  Filled 2019-08-13: qty 32

## 2019-08-18 ENCOUNTER — Ambulatory Visit: Payer: Medicare PPO | Admitting: Endocrinology

## 2019-08-18 ENCOUNTER — Telehealth: Payer: Self-pay

## 2019-08-18 DIAGNOSIS — E119 Type 2 diabetes mellitus without complications: Secondary | ICD-10-CM

## 2019-08-18 NOTE — Telephone Encounter (Signed)
Pt NS'd for 08/18/19 NP appt. Paper referral has been removed from nurse's referrals drawer and has been placed in referrals box WITH a post it note indicating NS for appt and to serve as a reminder to reschedule appt according to protocol.

## 2019-08-26 NOTE — Progress Notes (Deleted)
Cardiology Office Note:   Date:  08/26/2019  NAME:  Jason Joyce    MRN: [id number] DOB:  Oct 09, 1940   PCP:  Lynden Ang, DO  Cardiologist:  No primary care provider on file.  Electrophysiologist:  None   Referring MD: Lynden Ang, DO   No chief complaint on file. ***  History of Present Illness:   Jason Joyce is a 79 y.o. male with a hx of DM, PAD, DVT with IVC filter, COPD who presents for follow-up. He was seen last month for CP. Stress test normal. EKG with RBBB and echo showed septal motion consistent with bundle branch block.   Problem List 1. DM -A1c 10.5 -T chol 118, TG 74, HDL 48, LDL 55 2. DVT with IVC filter 3. PAD -ABI rt 0.20; L foot 0.19 (07/2018 [place]) 4. COPD -40 pack-year history 5. RBBB  Past Medical History: Past Medical History:  Diagnosis Date  . CHF (congestive heart failure) (HCC)   . COPD (chronic obstructive pulmonary disease) (HCC)   . Coronary artery disease   . Diabetes mellitus without complication (HCC)   . Hypertension   . Pulmonary embolism [facility])     Past Surgical History: Past Surgical History:  Procedure Laterality Date  . IVC FILTER INSERTION    . PENILE PROSTHESIS IMPLANT  about 2010    Current Medications: No outpatient medications have been marked as taking for the 08/27/19 encounter (Appointment) with O'Neal, Ronnald Ramp, MD.     Allergies:    Patient has no known allergies.   Social History: Social History   Socioeconomic History  . Marital status: Single    Spouse name: Not on file  . Number of children: Not on file  . Years of education: Not on file  . Highest education level: Not on file  Occupational History  . Not on file  Tobacco Use  . Smoking status: Former Smoker    Packs/day: 1.00    Years: 40.00    Pack years: 40.00    Types: Cigarettes    Quit date: 01/01/1994    Years since quitting: 25.6  . Smokeless tobacco: Never Used  Substance and Sexual Activity  . Alcohol use: Not  Currently  . Drug use: Never  . Sexual activity: Not on file  Other Topics Concern  . Not on file  Social History Narrative  . Not on file   Social Determinants of Health   Financial Resource Strain:   . Difficulty of Paying Living Expenses: Not on file  Food Insecurity:   . Worried About Programme researcher, broadcasting/film/video in the Last Year: Not on file  . Ran Out of Food in the Last Year: Not on file  Transportation Needs:   . Lack of Transportation (Medical): Not on file  . Lack of Transportation (Non-Medical): Not on file  Physical Activity:   . Days of Exercise per Week: Not on file  . Minutes of Exercise per Session: Not on file  Stress:   . Feeling of Stress : Not on file  Social Connections:   . Frequency of Communication with Friends and Family: Not on file  . Frequency of Social Gatherings with Friends and Family: Not on file  . Attends Religious Services: Not on file  . Active Member of Clubs or Organizations: Not on file  . Attends Banker Meetings: Not on file  . Marital Status: Not on file     Family History: The patient's ***family history includes Diabetes in  his mother; Heart disease in his mother; Kidney disease in his mother.  ROS:   All other ROS reviewed and negative. Pertinent positives noted in the HPI.     EKGs/Labs/Other Studies Reviewed:   The following studies were personally reviewed by me today:  EKG:  EKG is *** ordered today.  The ekg ordered today demonstrates ***, and was personally reviewed by me.  TTE 08/13/2019 1. Left ventricular ejection fraction, by estimation, is 50 to 55%. The  left ventricle has low normal function. The left ventricle demonstrates  regional wall motion abnormalities (see scoring diagram/findings for  description). There is moderate  concentric left ventricular hypertrophy. Left ventricular diastolic  parameters are consistent with Grade I diastolic dysfunction (impaired  relaxation). There is mild hypokinesis of  the left ventricular, basal-mid  inferoseptal wall and inferior wall.  2. Right ventricular systolic function is normal. The right ventricular  size is normal. Tricuspid regurgitation signal is inadequate for assessing  PA pressure.  3. Left atrial size was mild to moderately dilated.  4. The mitral valve is normal in structure. Trivial mitral valve  regurgitation.  5. NCC moderately calcified, rest mildly calcified.. The aortic valve is  tricuspid. Aortic valve regurgitation is mild to moderate.  6. Aortic dilatation noted. There is borderline dilatation of the  ascending aorta measuring 38 mm.  7. The inferior vena cava is normal in size with greater than 50%  respiratory variability, suggesting right atrial pressure of 3 mmHg.    NM Stress 08/13/2019  Nuclear stress EF: 59%.  There was no ST segment deviation noted during stress.  The study is normal.  This is a low risk study.  The left ventricular ejection fraction is normal (55-65%).   Normal resting and stress perfusion. No ischemia or infarction EF 58%   Recent Labs: 04/12/2019: ALT 17 07/20/2019: B Natriuretic Peptide 28.8; BUN 15; Creatinine, Ser 1.16; Hemoglobin 11.6; Platelets 229; Potassium 4.2; Sodium 141   Recent Lipid Panel No results found for: CHOL, TRIG, HDL, CHOLHDL, VLDL, LDLCALC, LDLDIRECT  Physical Exam:   VS:  There were no vitals taken for this visit.   Wt Readings from Last 3 Encounters:  08/13/19 265 lb (120.2 kg)  07/28/19 (!) 265 lb 9.6 oz (120.5 kg)  07/20/19 266 lb 1.5 oz (120.7 kg)    General: Well nourished, well developed, in no acute distress Heart: Atraumatic, normal size  Eyes: PEERLA, EOMI  Neck: Supple, no JVD Endocrine: No thryomegaly Cardiac: Normal S1, S2; RRR; no murmurs, rubs, or gallops Lungs: Clear to auscultation bilaterally, no wheezing, rhonchi or rales  Abd: Soft, nontender, no hepatomegaly  Ext: No edema, pulses 2+ Musculoskeletal: No deformities, BUE and BLE  strength normal and equal Skin: Warm and dry, no rashes   Neuro: Alert and oriented to person, place, time, and situation, CNII-XII grossly intact, no focal deficits  Psych: Normal mood and affect   ASSESSMENT:   Jason Joyce is a 79 y.o. male who presents for the following: No diagnosis found.  PLAN:   There are no diagnoses linked to this encounter.  Disposition: No follow-ups on file.  Medication Adjustments/Labs and Tests Ordered: Current medicines are reviewed at length with the patient today.  Concerns regarding medicines are outlined above.  No orders of the defined types were placed in this encounter.  No orders of the defined types were placed in this encounter.   There are no Patient Instructions on file for this visit.   Time Spent with Patient:  I have spent a total of *** minutes with patient reviewing hospital notes, telemetry, EKGs, labs and examining the patient as well as establishing an assessment and plan that was discussed with the patient.  > 50% of time was spent in direct patient care.  Signed, Lenna Gilford. Flora Lipps, MD [facility]  [address] [place], [place] [address] [phone]  08/26/2019 7:02 AM

## 2019-08-27 ENCOUNTER — Ambulatory Visit: Payer: Medicare PPO | Admitting: Cardiovascular Disease

## 2019-08-29 ENCOUNTER — Inpatient Hospital Stay (HOSPITAL_COMMUNITY): Admission: RE | Admit: 2019-08-29 | Payer: Medicare PPO | Source: Ambulatory Visit

## 2019-09-01 ENCOUNTER — Other Ambulatory Visit: Payer: Self-pay | Admitting: *Deleted

## 2019-09-01 DIAGNOSIS — R0609 Other forms of dyspnea: Secondary | ICD-10-CM

## 2019-09-02 ENCOUNTER — Ambulatory Visit: Payer: Medicare PPO | Admitting: Internal Medicine

## 2019-09-02 NOTE — Progress Notes (Deleted)
Jason Joyce, male    DOB: 1940-04-25,    MRN: [id number]   Brief patient profile:  5 yobm   Quit smoking 01/1994 s obvious resp sequelae  living in [place] with h/o dvt/ filter  By [facility] "years ago"  admitted to [facility] "  x 3-4  months" with dx of covid 19 pna and d/c off of 02  But severely debilitated and moved to [place] "no one would take me since I had covid" and referred to pulmonary clinic 07/14/2019 by Dr   Allena Katz for DOE ? Copd      History of Present Illness  07/14/2019  Pulmonary/ 1st office eval/Adler Chartrand  Chief Complaint  Patient presents with  . Pulmonary Consult    Referred by Dr Allena Katz for eval of COPD. Pt c/o SOB since had Covid 19 around Sept 2020.   Dyspnea:  MMRC3 = can't walk 100 yards even at a slow pace at a flat grade s stopping due to sob  Usually uses scooter at [employer] previous to covid could usually just push a cart  Cough none Sleep: freq urination/ no resp cc's SABA use: none / they don't help rec I would not use Nifedipine due to your tendency to leg swelling and if it persists you will need a vein speciast (Dr  Bosie Helper group) Try off nifedipine and on cardura 4 mg twice daily (should also help your urine flow)  Keep your legs elevated and wrapped as much as you can  Please schedule a follow up office visit in 6-8  weeks, call sooner if needed with pfts on return     09/02/2019  f/u ov/Jason Joyce re:  No chief complaint on file.    Dyspnea:  *** Cough: *** Sleeping: *** SABA use: *** 02: ***   No obvious day to day or daytime variability or assoc excess/ purulent sputum or mucus plugs or hemoptysis or cp or chest tightness, subjective wheeze or overt sinus or hb symptoms.   *** without nocturnal  or early am exacerbation  of respiratory  c/o's or need for noct saba. Also denies any obvious fluctuation of symptoms with weather or environmental changes or other aggravating or alleviating factors except as outlined above   No unusual exposure  hx or h/o childhood pna/ asthma or knowledge of premature birth.  Current Allergies, Complete Past Medical History, Past Surgical History, Family History, and Social History were reviewed in [employer] record.  ROS  The following are not active complaints unless bolded Hoarseness, sore throat, dysphagia, dental problems, itching, sneezing,  nasal congestion or discharge of excess mucus or purulent secretions, ear ache,   fever, chills, sweats, unintended wt loss or wt gain, classically pleuritic or exertional cp,  orthopnea pnd or arm/hand swelling  or leg swelling, presyncope, palpitations, abdominal pain, anorexia, nausea, vomiting, diarrhea  or change in bowel habits or change in bladder habits, change in stools or change in urine, dysuria, hematuria,  rash, arthralgias, visual complaints, headache, numbness, weakness or ataxia or problems with walking or coordination,  change in mood or  memory.        No outpatient medications have been marked as taking for the 09/02/19 encounter (Appointment) with Nyoka Cowden, MD.             Objective:    Wt Readings from Last 3 Encounters:  08/13/19 265 lb (120.2 kg)  07/28/19 (!) 265 lb 9.6 oz (120.5 kg)  07/20/19 266 lb 1.5 oz (  120.7 kg)     Vital signs reviewed - Note on arrival 09/02/2019  02 sats  ***% on ***        LUNGS: no acc muscle use,  Min barrel     1+ pitting both LE's          CXR PA and Lateral:   07/14/2019 :    I personally reviewed images and   impression as follows:   Elevated L HD/ lungs clear               Assessment

## 2019-09-08 ENCOUNTER — Telehealth: Payer: Self-pay | Admitting: Internal Medicine

## 2019-09-08 NOTE — Telephone Encounter (Signed)
He did not disclose that he was on flomax but needs cardura for bp to replace nifedipine so rec  Stay off nifedipine Stop flomax Continue cardura 4 mg bid since also treats bph   Copy to PCP

## 2019-09-08 NOTE — Telephone Encounter (Signed)
Called and spoke with Jason Joyce to let him know recs from Dr. Sherene Sires. He expressed understanding. Nothing further needed at this time.

## 2019-09-08 NOTE — Telephone Encounter (Signed)
Called and spoke with Orin at pharmacy and he states that patient is taking flomax - and adding doxazosin (CARDURA) 4 MG tablet - needs to check on this and make sure that provider wants patient on both medications   Dr. Sherene Sires please advise [phone]

## 2019-09-11 ENCOUNTER — Other Ambulatory Visit: Payer: Self-pay | Admitting: Internal Medicine

## 2019-09-11 NOTE — Telephone Encounter (Signed)
Pharmacy was closed for lunch when I called. Will call back after 2pm. As documented in the phone note on 09/08/19, patient is to stop the Flomax.

## 2019-09-11 NOTE — Telephone Encounter (Signed)
Spoke with Orin again. He was made aware of MW's recs from 09/08/19. Verbalized understanding.   Nothing further needed at time of call.

## 2019-09-16 ENCOUNTER — Other Ambulatory Visit: Payer: Self-pay

## 2019-09-16 ENCOUNTER — Ambulatory Visit (INDEPENDENT_AMBULATORY_CARE_PROVIDER_SITE_OTHER): Payer: Medicare PPO | Admitting: Ophthalmology

## 2019-09-16 ENCOUNTER — Encounter (INDEPENDENT_AMBULATORY_CARE_PROVIDER_SITE_OTHER): Payer: Self-pay | Admitting: Ophthalmology

## 2019-09-16 DIAGNOSIS — H34811 Central retinal vein occlusion, right eye, with macular edema: Secondary | ICD-10-CM

## 2019-09-16 DIAGNOSIS — H348111 Central retinal vein occlusion, right eye, with retinal neovascularization: Secondary | ICD-10-CM | POA: Diagnosis not present

## 2019-09-16 MED ORDER — BEVACIZUMAB CHEMO INJECTION 1.25MG/0.05ML SYRINGE FOR KALEIDOSCOPE
1.2500 mg | INTRAVITREAL | Status: AC | PRN
  Administered 2019-09-16: 1.25 mg via INTRAVITREAL

## 2019-09-16 MED ORDER — BRIMONIDINE TARTRATE 0.15 % OP SOLN
1.0000 [drp] | Freq: Three times a day (TID) | OPHTHALMIC | 1 refills | Status: DC
Start: 2019-09-16 — End: 2019-11-19

## 2019-09-16 NOTE — Patient Instructions (Signed)
Patient instructed in use of brimonidine topically to the right eye 3 times daily

## 2019-09-16 NOTE — Progress Notes (Signed)
09/16/2019     CHIEF COMPLAINT Patient presents for Retina Follow Up   HISTORY OF PRESENT ILLNESS: Jason Joyce is a 79 y.o. male who presents to the clinic today for:   HPI    Retina Follow Up    Patient presents with  CRVO/BRVO.  In right eye.  Severity is moderate.  Duration of 5 weeks.  Since onset it is stable.  I, the attending physician,  performed the HPI with the patient and updated documentation appropriately.          Comments    5 Week CRVO f\u OD. Possible Avastin OD. OCT  Pt states vision has become worse.  BGL: 250       Last edited by Elyse Jarvis on 09/16/2019 10:03 AM. (History)      [place] [address] [place],  [place] [address]  HISTORICAL INFORMATION:   Selected notes from the MEDICAL RECORD NUMBER       CURRENT MEDICATIONS: Current Outpatient Medications (Ophthalmic Drugs)  Medication Sig  . brimonidine (ALPHAGAN) 0.15 % ophthalmic solution Place 1 drop into the right eye every 8 (eight) hours.  Marland Kitchen latanoprost (XALATAN) 0.005 % ophthalmic solution Place 1 drop into the left eye at bedtime. Patient advised that he gets a shot in his right eye and uses eye drops in his left.   No current facility-administered medications for this visit. (Ophthalmic Drugs)   Current Outpatient Medications (Other)  Medication Sig  . aspirin EC 81 MG tablet Take 81 mg by mouth daily. Swallow whole.  Marland Kitchen atorvastatin (LIPITOR) 40 MG tablet Take 40 mg by mouth at bedtime.  . Baclofen 5 MG TABS Take 5 mg by mouth in the morning, at noon, and at bedtime. X 14 days  . doxazosin (CARDURA) 4 MG tablet Take 1 tablet (4 mg total) by mouth 2 (two) times daily.  Marland Kitchen ELIQUIS 5 MG TABS tablet Take 5 mg by mouth 2 (two) times daily.   . furosemide (LASIX) 40 MG tablet Take 1 tablet (40 mg total) by mouth daily.  Marland Kitchen HYDROcodone-acetaminophen (NORCO/VICODIN) 5-325 MG tablet Take 1 tablet by mouth in the morning and at  bedtime.  . Insulin Glargine (BASAGLAR KWIKPEN) 100 UNIT/ML Inject 29 Units into the skin at bedtime. Per RPh @@ [employer] - this has not been picked up yet.  . insulin NPH-regular Human (70-30) 100 UNIT/ML injection Inject 15-25 Units into the skin See admin instructions. Per sliding scale.  . losartan (COZAAR) 100 MG tablet Take 100 mg by mouth daily.   . metFORMIN (GLUCOPHAGE) 1000 MG tablet Take 1,000 mg by mouth 2 (two) times daily with a meal.  . metoprolol tartrate (LOPRESSOR) 50 MG tablet Take 50 mg by mouth 2 (two) times daily.   Marland Kitchen NIFEdipine (ADALAT CC) 90 MG 24 hr tablet Take 90 mg by mouth daily.    No current facility-administered medications for this visit. (Other)      REVIEW OF SYSTEMS: ROS    Positive for: Endocrine   Last edited by Elyse Jarvis on 09/16/2019 10:03 AM. (History)       ALLERGIES No Known Allergies  PAST MEDICAL HISTORY Past Medical History:  Diagnosis Date  . CHF (congestive heart failure) (HCC)   . COPD (chronic obstructive pulmonary disease) (HCC)   . Coronary artery disease   . Diabetes mellitus without complication (HCC)   . Hypertension   . Pulmonary embolism (HCC)  Past Surgical History:  Procedure Laterality Date  . IVC FILTER INSERTION    . PENILE PROSTHESIS IMPLANT  about 2010    FAMILY HISTORY Family History  Problem Relation Age of Onset  . Diabetes Mother   . Heart disease Mother   . Kidney disease Mother     SOCIAL HISTORY Social History   Tobacco Use  . Smoking status: Former Smoker    Packs/day: 1.00    Years: 40.00    Pack years: 40.00    Types: Cigarettes    Quit date: 01/01/1994    Years since quitting: 25.7  . Smokeless tobacco: Never Used  Substance Use Topics  . Alcohol use: Not Currently  . Drug use: Never         OPHTHALMIC EXAM:  Base Eye Exam    Visual Acuity (Snellen - Linear)      Right Left   Dist [place] HM 20/50 -2   Dist ph [place]  NI       Tonometry (Tonopen, 10:11 AM)      Right  Left   Pressure 34 21       Pupils      Pupils Dark Light Shape React APD   Right PERRL 4 3 Round Brisk +1   Left PERRL 4 3 Round Brisk None       Visual Fields (Counting fingers)      Left Right    Full    Restrictions  Total superior temporal, inferior temporal, superior nasal, inferior nasal deficiencies       Neuro/Psych    Oriented x3: Yes   Mood/Affect: Normal        Slit Lamp and Fundus Exam    External Exam      Right Left   External Normal Normal       Slit Lamp Exam      Right Left   Lids/Lashes Normal Normal   Conjunctiva/Sclera White and quiet White and quiet   Cornea Clear Clear   Anterior Chamber Deep and quiet Deep and quiet   Iris Round and reactive Round and reactive   Lens 3+ Nuclear sclerosis    Anterior Vitreous Normal Normal          IMAGING AND PROCEDURES  Imaging and Procedures for 09/16/19  OCT, Retina - OU - Both Eyes       Right Eye Quality was good. Scan locations included subfoveal. Central Foveal Thickness: 359. Progression has been stable. Findings include cystoid macular edema.   Left Eye Quality was good. Scan locations included subfoveal. Central Foveal Thickness: 245. Progression has been stable.   Notes OD with chronic CME, from ischemic CRV O, will repeat intravitreal Avastin today and follow-up in 2 weeks for PRP OD       Intravitreal Injection, Pharmacologic Agent - OD - Right Eye       Time Out 09/16/2019. 11:16 AM. Confirmed correct patient, procedure, site, and patient consented.   Anesthesia Topical anesthesia was used. Anesthetic medications included Akten 3.5%.   Procedure Preparation included Ofloxacin , 5% betadine to ocular surface, 10% betadine to eyelids, Tobramycin 0.3%. A supplied needle was used.   Injection:  1.25 mg Bevacizumab (AVASTIN) SOLN   NDC: [address]-7, Lot: [id number]   Route: Intravitreal, Site: Right Eye, Waste: 0 mg  Post-op Post injection exam found visual acuity of at least  counting fingers. The patient tolerated the procedure well. There were no complications. The patient received written and verbal post procedure care education.  Post injection medications were not given.                 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 We will add topical therapy OD to lower the pressure, will need to avoid beta-blockers topically  3.  Ophthalmic Meds Ordered this visit:  Meds ordered this encounter  Medications  . Bevacizumab (AVASTIN) SOLN 1.25 mg  . brimonidine (ALPHAGAN) 0.15 % ophthalmic solution    Sig: Place 1 drop into the right eye every 8 (eight) hours.    Dispense:  5 mL    Refill:  1       Return in about 2 weeks (around 09/30/2019) for dilate, OD, PRP.  Patient Instructions  Patient instructed in use of brimonidine topically to the right eye 3 times daily    Explained the diagnoses, plan, and follow up with the patient and they expressed understanding.  Patient expressed understanding of the importance of proper follow up care.   Alford Highland Dellanira Dillow M.D. Diseases & Surgery of the Retina and Vitreous [facility] 09/16/19     Abbreviations: M myopia (nearsighted); A astigmatism; H hyperopia (farsighted); P presbyopia; Mrx spectacle prescription;  CTL contact lenses; OD right eye; OS left eye;  OU both eyes  XT exotropia; ET esotropia; PEK punctate epithelial keratitis; PEE punctate epithelial erosions; DES dry eye syndrome; MGD meibomian gland dysfunction; ATs artificial tears; PFAT's preservative free artificial tears; NSC nuclear sclerotic cataract; PSC posterior subcapsular cataract; ERM epi-retinal membrane; PVD posterior vitreous detachment; RD retinal detachment; DM diabetes mellitus; DR diabetic retinopathy; NPDR non-proliferative diabetic retinopathy; PDR proliferative diabetic retinopathy; CSME clinically significant macular edema; DME diabetic macular edema; dbh dot blot hemorrhages; CWS cotton wool spot; POAG primary open angle glaucoma; C/D cup-to-disc ratio; HVF humphrey visual field; GVF goldmann visual field; OCT optical coherence tomography; IOP intraocular pressure; BRVO Branch retinal vein occlusion; CRVO central retinal vein occlusion; CRAO central retinal artery occlusion; BRAO branch retinal artery occlusion; RT retinal tear; SB scleral buckle; PPV pars plana vitrectomy; VH Vitreous hemorrhage; PRP panretinal laser photocoagulation; IVK intravitreal kenalog; VMT vitreomacular traction; MH Macular hole;  NVD neovascularization of the disc; NVE neovascularization elsewhere; AREDS age related eye disease study; ARMD age related macular degeneration; POAG primary open angle glaucoma; EBMD epithelial/anterior basement membrane dystrophy; ACIOL anterior chamber intraocular lens; IOL intraocular lens; PCIOL posterior chamber intraocular lens; Phaco/IOL phacoemulsification with intraocular lens placement; PRK photorefractive keratectomy; LASIK laser assisted in situ keratomileusis; HTN hypertension; DM diabetes mellitus; COPD chronic obstructive pulmonary disease

## 2019-09-16 NOTE — Assessment & Plan Note (Signed)
Will need to proceed with intravitreal Avastin OD today to control NVG  Will return visit in 2 weeks to reassess the effects dilate and further PRP

## 2019-10-05 ENCOUNTER — Encounter (INDEPENDENT_AMBULATORY_CARE_PROVIDER_SITE_OTHER): Payer: Medicare PPO | Admitting: Ophthalmology

## 2019-10-06 ENCOUNTER — Encounter (INDEPENDENT_AMBULATORY_CARE_PROVIDER_SITE_OTHER): Payer: Self-pay | Admitting: Ophthalmology

## 2019-10-06 ENCOUNTER — Ambulatory Visit (INDEPENDENT_AMBULATORY_CARE_PROVIDER_SITE_OTHER): Payer: Medicare PPO | Admitting: Ophthalmology

## 2019-10-06 ENCOUNTER — Other Ambulatory Visit: Payer: Self-pay

## 2019-10-06 DIAGNOSIS — H348111 Central retinal vein occlusion, right eye, with retinal neovascularization: Secondary | ICD-10-CM | POA: Diagnosis not present

## 2019-10-06 NOTE — Progress Notes (Signed)
10/06/2019     CHIEF COMPLAINT Patient presents for Retina Follow Up   HISTORY OF PRESENT ILLNESS: Jason Joyce is a 79 y.o. male who presents to the clinic today for:   HPI    Retina Follow Up    Patient presents with  CRVO/BRVO.  In right eye.  This started 3 weeks ago.  Severity is mild.  Duration of 3 weeks.  Since onset it is stable.          Comments    3 Week PRP OD  Pt denies noticeable changes to [place] OU since last visit. Pt denies ocular pain, flashes of light, or floaters OU.  LBS: 200 this AM       Last edited by Ileana Roup, COA on 10/06/2019  9:08 AM. (History)      [place]: [place] [address] [place],  [place] [address]  HISTORICAL INFORMATION:   Selected notes from the MEDICAL RECORD NUMBER       CURRENT MEDICATIONS: Current Outpatient Medications (Ophthalmic Drugs)  Medication Sig  . brimonidine (ALPHAGAN) 0.15 % ophthalmic solution Place 1 drop into the right eye every 8 (eight) hours.  Marland Kitchen latanoprost (XALATAN) 0.005 % ophthalmic solution Place 1 drop into the left eye at bedtime. Patient advised that he gets a shot in his right eye and uses eye drops in his left.   No current facility-administered medications for this visit. (Ophthalmic Drugs)   Current Outpatient Medications (Other)  Medication Sig  . aspirin EC 81 MG tablet Take 81 mg by mouth daily. Swallow whole.  Marland Kitchen atorvastatin (LIPITOR) 40 MG tablet Take 40 mg by mouth at bedtime.  . Baclofen 5 MG TABS Take 5 mg by mouth in the morning, at noon, and at bedtime. X 14 days  . doxazosin (CARDURA) 4 MG tablet Take 1 tablet (4 mg total) by mouth 2 (two) times daily.  Marland Kitchen ELIQUIS 5 MG TABS tablet Take 5 mg by mouth 2 (two) times daily.   . furosemide (LASIX) 40 MG tablet Take 1 tablet (40 mg total) by mouth daily.  Marland Kitchen HYDROcodone-acetaminophen (NORCO/VICODIN) 5-325 MG tablet Take 1 tablet by mouth in the morning and at bedtime.  . Insulin  Glargine (BASAGLAR KWIKPEN) 100 UNIT/ML Inject 29 Units into the skin at bedtime. Per RPh @@ [employer] - this has not been picked up yet.  . insulin NPH-regular Human (70-30) 100 UNIT/ML injection Inject 15-25 Units into the skin See admin instructions. Per sliding scale.  . losartan (COZAAR) 100 MG tablet Take 100 mg by mouth daily.   . metFORMIN (GLUCOPHAGE) 1000 MG tablet Take 1,000 mg by mouth 2 (two) times daily with a meal.  . metoprolol tartrate (LOPRESSOR) 50 MG tablet Take 50 mg by mouth 2 (two) times daily.   Marland Kitchen NIFEdipine (ADALAT CC) 90 MG 24 hr tablet Take 90 mg by mouth daily.    No current facility-administered medications for this visit. (Other)      REVIEW OF SYSTEMS:    ALLERGIES No Known Allergies  PAST MEDICAL HISTORY Past Medical History:  Diagnosis Date  . CHF (congestive heart failure) (HCC)   . COPD (chronic obstructive pulmonary disease) (HCC)   . Coronary artery disease   . Diabetes mellitus without complication (HCC)   . Hypertension   . Pulmonary embolism [facility])    Past Surgical History:  Procedure Laterality Date  . IVC FILTER INSERTION    . PENILE PROSTHESIS IMPLANT  about  2010    FAMILY HISTORY Family History  Problem Relation Age of Onset  . Diabetes Mother   . Heart disease Mother   . Kidney disease Mother     SOCIAL HISTORY Social History   Tobacco Use  . Smoking status: Former Smoker    Packs/day: 1.00    Years: 40.00    Pack years: 40.00    Types: Cigarettes    Quit date: 01/01/1994    Years since quitting: 25.7  . Smokeless tobacco: Never Used  Substance Use Topics  . Alcohol use: Not Currently  . Drug use: Never         OPHTHALMIC EXAM: Base Eye Exam    Visual Acuity (ETDRS)      Right Left   Dist [place] HM 20/40   Dist ph [place] NI NI       Tonometry (Tonopen, 9:08 AM)      Right Left   Pressure 14 18       Pupils      Dark Light Shape React APD   Right 4 3 Round Brisk +1   Left 4 3 Round Brisk None       Visual  Fields (Counting fingers)      Left Right    Full    Restrictions  Total superior temporal, inferior temporal, superior nasal, inferior nasal deficiencies       Extraocular Movement      Right Left    Full Full       Neuro/Psych    Oriented x3: Yes   Mood/Affect: Normal       Dilation    Right eye: 1.0% Mydriacyl, 2.5% Phenylephrine @ 9:11 AM          IMAGING AND PROCEDURES  Imaging and Procedures for 10/06/19  Panretinal Photocoagulation - OD - Right Eye       Anesthesia Topical anesthesia was used. Anesthetic medications included Proparacaine 0.5%.   Laser Information The type of laser was diode. Color was yellow. The duration in seconds was 0.04. The spot size was 390 microns. Laser power was 350. Total spots was 877.   Post-op The patient tolerated the procedure well. There were no complications. The patient received written and verbal post procedure care education.   Notes Superonasal quadrant treated peripherally with 0.25 spot size spacing.                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  PRP #2 completed today for anterior segment neovascularization,  secondary neovascular glaucoma 2.  Return visit in 3- 4 weeks for more PRP to long-term control this condition  3.  Patient asked to continue his topical medical therapy to the right eye  Ophthalmic Meds Ordered this visit:  No orders of the defined types were placed in this encounter.      Return in about 3 weeks (around 10/27/2019) for dilate, OD, PRP.  There are no Patient Instructions on file for this visit.   Explained the diagnoses, plan, and  follow up with the patient and they expressed understanding.  Patient expressed understanding of the importance of proper follow up care.   Alford Highland Macai Sisneros M.D. Diseases &  Surgery of the Retina and Vitreous [facility] 10/06/19     Abbreviations: M myopia (nearsighted); A astigmatism; H hyperopia (farsighted); P presbyopia; Mrx spectacle prescription;  CTL contact lenses; OD right eye; OS left eye; OU both eyes  XT exotropia; ET esotropia; PEK punctate epithelial keratitis; PEE punctate epithelial erosions; DES dry eye syndrome; MGD meibomian gland dysfunction; ATs artificial tears; PFAT's preservative free artificial tears; NSC nuclear sclerotic cataract; PSC posterior subcapsular cataract; ERM epi-retinal membrane; PVD posterior vitreous detachment; RD retinal detachment; DM diabetes mellitus; DR diabetic retinopathy; NPDR non-proliferative diabetic retinopathy; PDR proliferative diabetic retinopathy; CSME clinically significant macular edema; DME diabetic macular edema; dbh dot blot hemorrhages; CWS cotton wool spot; POAG primary open angle glaucoma; C/D cup-to-disc ratio; HVF humphrey visual field; GVF goldmann visual field; OCT optical coherence tomography; IOP intraocular pressure; BRVO Branch retinal vein occlusion; CRVO central retinal vein occlusion; CRAO central retinal artery occlusion; BRAO branch retinal artery occlusion; RT retinal tear; SB scleral buckle; PPV pars plana vitrectomy; VH Vitreous hemorrhage; PRP panretinal laser photocoagulation; IVK intravitreal kenalog; VMT vitreomacular traction; MH Macular hole;  NVD neovascularization of the disc; NVE neovascularization elsewhere; AREDS age related eye disease study; ARMD age related macular degeneration; POAG primary open angle glaucoma; EBMD epithelial/anterior basement membrane dystrophy; ACIOL anterior chamber intraocular lens; IOL intraocular lens; PCIOL posterior chamber intraocular lens; Phaco/IOL  phacoemulsification with intraocular lens placement; PRK photorefractive keratectomy; LASIK laser assisted in situ keratomileusis; HTN hypertension; DM diabetes mellitus; COPD chronic obstructive pulmonary disease

## 2019-10-06 NOTE — Assessment & Plan Note (Signed)
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

## 2019-10-27 ENCOUNTER — Ambulatory Visit (INDEPENDENT_AMBULATORY_CARE_PROVIDER_SITE_OTHER): Payer: Medicare PPO | Admitting: Ophthalmology

## 2019-10-27 ENCOUNTER — Other Ambulatory Visit: Payer: Self-pay

## 2019-10-27 ENCOUNTER — Encounter (INDEPENDENT_AMBULATORY_CARE_PROVIDER_SITE_OTHER): Payer: Self-pay | Admitting: Ophthalmology

## 2019-10-27 DIAGNOSIS — H348111 Central retinal vein occlusion, right eye, with retinal neovascularization: Secondary | ICD-10-CM | POA: Diagnosis not present

## 2019-10-27 NOTE — Progress Notes (Signed)
10/27/2019     CHIEF COMPLAINT Patient presents for Retina Follow Up   HISTORY OF PRESENT ILLNESS: Jason Joyce is a 79 y.o. male who presents to the clinic today for:   HPI    Retina Follow Up    Patient presents with  CRVO/BRVO.  In right eye.  This started 3 weeks ago.  Severity is severe.  Duration of 3 weeks.  Since onset it is stable.          Comments    3 Week PRP OD,,,    Pt denies noticeable changes to [place] OU since last visit. Pt denies flashes of light or floaters OU. Pt c/o HA over eyes.        Last edited by Edmon Crape, MD on 10/27/2019  4:13 PM. (History)      [place] [address] [place],  [place] [address]  HISTORICAL INFORMATION:   Selected notes from the MEDICAL RECORD NUMBER       CURRENT MEDICATIONS: Current Outpatient Medications (Ophthalmic Drugs)  Medication Sig  . brimonidine (ALPHAGAN) 0.15 % ophthalmic solution Place 1 drop into the right eye every 8 (eight) hours.  Marland Kitchen latanoprost (XALATAN) 0.005 % ophthalmic solution Place 1 drop into the left eye at bedtime. Patient advised that he gets a shot in his right eye and uses eye drops in his left.   No current facility-administered medications for this visit. (Ophthalmic Drugs)   Current Outpatient Medications (Other)  Medication Sig  . aspirin EC 81 MG tablet Take 81 mg by mouth daily. Swallow whole.  Marland Kitchen atorvastatin (LIPITOR) 40 MG tablet Take 40 mg by mouth at bedtime.  . Baclofen 5 MG TABS Take 5 mg by mouth in the morning, at noon, and at bedtime. X 14 days  . doxazosin (CARDURA) 4 MG tablet Take 1 tablet (4 mg total) by mouth 2 (two) times daily.  Marland Kitchen ELIQUIS 5 MG TABS tablet Take 5 mg by mouth 2 (two) times daily.   . furosemide (LASIX) 40 MG tablet Take 1 tablet (40 mg total) by mouth daily.  Marland Kitchen HYDROcodone-acetaminophen (NORCO/VICODIN) 5-325 MG tablet Take 1 tablet by mouth in the morning and at bedtime.  . Insulin Glargine  (BASAGLAR KWIKPEN) 100 UNIT/ML Inject 29 Units into the skin at bedtime. Per RPh @@ [employer] - this has not been picked up yet.  . insulin NPH-regular Human (70-30) 100 UNIT/ML injection Inject 15-25 Units into the skin See admin instructions. Per sliding scale.  . losartan (COZAAR) 100 MG tablet Take 100 mg by mouth daily.   . metFORMIN (GLUCOPHAGE) 1000 MG tablet Take 1,000 mg by mouth 2 (two) times daily with a meal.  . metoprolol tartrate (LOPRESSOR) 50 MG tablet Take 50 mg by mouth 2 (two) times daily.   Marland Kitchen NIFEdipine (ADALAT CC) 90 MG 24 hr tablet Take 90 mg by mouth daily.    No current facility-administered medications for this visit. (Other)      REVIEW OF SYSTEMS:    ALLERGIES No Known Allergies  PAST MEDICAL HISTORY Past Medical History:  Diagnosis Date  . CHF (congestive heart failure) (HCC)   . COPD (chronic obstructive pulmonary disease) (HCC)   . Coronary artery disease   . Diabetes mellitus without complication (HCC)   . Hypertension   . Pulmonary embolism [facility])    Past Surgical History:  Procedure Laterality Date  . IVC FILTER INSERTION    . PENILE PROSTHESIS IMPLANT  about 2010    FAMILY HISTORY Family History  Problem Relation Age of Onset  . Diabetes Mother   . Heart disease Mother   . Kidney disease Mother     SOCIAL HISTORY Social History   Tobacco Use  . Smoking status: Former Smoker    Packs/day: 1.00    Years: 40.00    Pack years: 40.00    Types: Cigarettes    Quit date: 01/01/1994    Years since quitting: 25.8  . Smokeless tobacco: Never Used  Substance Use Topics  . Alcohol use: Not Currently  . Drug use: Never         OPHTHALMIC EXAM:  Base Eye Exam    Visual Acuity (ETDRS)      Right Left   Dist [place] HM barely 20/60   Dist ph [place] NI 20/30       Tonometry (Tonopen, 3:58 PM)      Right Left   Pressure 27 17       Pupils      Dark Light Shape React APD   Right 4 3 Round Brisk +1   Left 4 3 Round Brisk None        Visual Fields (Counting fingers)      Left Right    Full    Restrictions  Total superior temporal, inferior temporal, superior nasal, inferior nasal deficiencies       Extraocular Movement      Right Left    Full Full       Neuro/Psych    Oriented x3: Yes   Mood/Affect: Normal       Dilation    Right eye: 1.0% Mydriacyl, 2.5% Phenylephrine @ 4:04 PM        Slit Lamp and Fundus Exam    External Exam      Right Left   External Normal Normal       Slit Lamp Exam      Right Left   Lids/Lashes Normal Normal   Conjunctiva/Sclera White and quiet White and quiet   Cornea Clear Clear   Anterior Chamber Deep and quiet Deep and quiet   Iris Poor review of detail with wheelchair examination Round and reactive   Lens 3+ Nuclear sclerosis    Anterior Vitreous Normal Normal       Fundus Exam      Right Left   Posterior Vitreous Normal    Disc Normal    C/D Ratio 0.4    Macula Microaneurysms, Hemorrhage    Vessels Ischemic CRV O, intraretinal hemorrhage seen with small pupil indirect scope though poorly dilated    Periphery Good nasal peripheral PRP, room superotemporally for more           IMAGING AND PROCEDURES  Imaging and Procedures for 10/27/19  Panretinal Photocoagulation - OD - Right Eye       Time Out Confirmed correct patient, procedure, site, and patient consented.   Anesthesia Topical anesthesia was used.   Laser Information The type of laser was diode. Color was yellow. The duration in seconds was 0.02. The spot size was 390 microns. Laser power was 300. Total spots was 772.   Post-op The patient tolerated the procedure well. There were no complications. The patient received written and verbal post procedure care education.   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  Further PRP delivered today      ICD-10-CM   1. 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  The PRP will be needed delivered today to regions of nonperfusion.  2.  Extreme effort was undertaken today with technician staff and physician with patient in wheelchair to deliver PRP via NAVILAS laser temporally  3.  Continue on topical medications of the right eye  Ophthalmic Meds Ordered this visit:  No orders of the defined types were placed in this encounter.      Return in about 3 weeks (around 11/17/2019) for No dilation, IOP check.  There are no Patient Instructions on file for this visit.   Explained the diagnoses, plan, and follow up with the patient and they expressed understanding.  Patient expressed understanding of the importance of proper follow up care.   Alford Highland Dareen Gutzwiller M.D. Diseases & Surgery of the Retina and Vitreous [facility] 10/27/19     Abbreviations: M myopia (nearsighted); A astigmatism; H hyperopia (farsighted); P presbyopia; Mrx spectacle prescription;  CTL contact lenses; OD right eye; OS left eye; OU both eyes  XT exotropia; ET esotropia; PEK punctate epithelial keratitis; PEE punctate epithelial erosions; DES dry eye syndrome; MGD meibomian gland dysfunction; ATs artificial tears; PFAT's preservative free artificial tears; NSC nuclear sclerotic cataract; PSC posterior subcapsular cataract; ERM epi-retinal membrane; PVD posterior vitreous detachment; RD retinal detachment; DM diabetes mellitus; DR diabetic retinopathy; NPDR non-proliferative diabetic retinopathy; PDR proliferative diabetic retinopathy; CSME clinically significant  macular edema; DME diabetic macular edema; dbh dot blot hemorrhages; CWS cotton wool spot; POAG primary open angle glaucoma; C/D cup-to-disc ratio; HVF humphrey visual field; GVF goldmann visual field; OCT optical coherence tomography; IOP intraocular pressure; BRVO Branch retinal vein occlusion; CRVO central retinal vein occlusion; CRAO central retinal artery occlusion; BRAO branch retinal artery occlusion; RT retinal tear; SB scleral buckle; PPV pars plana vitrectomy; VH Vitreous hemorrhage; PRP panretinal laser photocoagulation; IVK intravitreal kenalog; VMT vitreomacular traction; MH Macular hole;  NVD neovascularization of the disc; NVE neovascularization elsewhere; AREDS age related eye disease study; ARMD age related macular degeneration; POAG primary open angle glaucoma; EBMD epithelial/anterior basement membrane dystrophy; ACIOL anterior chamber intraocular lens; IOL intraocular lens; PCIOL posterior chamber intraocular lens; Phaco/IOL phacoemulsification with intraocular lens placement; PRK photorefractive keratectomy; LASIK laser assisted in situ keratomileusis; HTN hypertension; DM diabetes mellitus; COPD chronic obstructive pulmonary disease

## 2019-10-27 NOTE — Assessment & Plan Note (Signed)
Still secondary neovascular complications will need to continue topical therapy.  Further PRP delivered today

## 2019-10-31 ENCOUNTER — Other Ambulatory Visit: Payer: Self-pay

## 2019-10-31 ENCOUNTER — Encounter (HOSPITAL_COMMUNITY): Payer: Self-pay | Admitting: Emergency Medicine

## 2019-10-31 ENCOUNTER — Emergency Department (HOSPITAL_COMMUNITY): Payer: Medicare PPO

## 2019-10-31 ENCOUNTER — Emergency Department (HOSPITAL_COMMUNITY)
Admission: EM | Admit: 2019-10-31 | Discharge: 2019-10-31 | Disposition: A | Discharge status: Home / Self Care | Payer: Medicare PPO | Attending: Emergency Medicine | Admitting: Emergency Medicine

## 2019-10-31 DIAGNOSIS — R600 Localized edema: Secondary | ICD-10-CM | POA: Diagnosis not present

## 2019-10-31 DIAGNOSIS — Z79899 Other long term (current) drug therapy: Secondary | ICD-10-CM | POA: Diagnosis not present

## 2019-10-31 DIAGNOSIS — Z87891 Personal history of nicotine dependence: Secondary | ICD-10-CM | POA: Insufficient documentation

## 2019-10-31 DIAGNOSIS — R079 Chest pain, unspecified: Secondary | ICD-10-CM | POA: Diagnosis not present

## 2019-10-31 DIAGNOSIS — Z7982 Long term (current) use of aspirin: Secondary | ICD-10-CM | POA: Insufficient documentation

## 2019-10-31 DIAGNOSIS — J449 Chronic obstructive pulmonary disease, unspecified: Secondary | ICD-10-CM | POA: Diagnosis not present

## 2019-10-31 DIAGNOSIS — I110 Hypertensive heart disease with heart failure: Secondary | ICD-10-CM | POA: Diagnosis not present

## 2019-10-31 DIAGNOSIS — R072 Precordial pain: Secondary | ICD-10-CM | POA: Diagnosis present

## 2019-10-31 DIAGNOSIS — I509 Heart failure, unspecified: Secondary | ICD-10-CM | POA: Diagnosis not present

## 2019-10-31 DIAGNOSIS — E1140 Type 2 diabetes mellitus with diabetic neuropathy, unspecified: Secondary | ICD-10-CM | POA: Insufficient documentation

## 2019-10-31 DIAGNOSIS — M25512 Pain in left shoulder: Secondary | ICD-10-CM | POA: Diagnosis not present

## 2019-10-31 DIAGNOSIS — E1165 Type 2 diabetes mellitus with hyperglycemia: Secondary | ICD-10-CM | POA: Diagnosis not present

## 2019-10-31 DIAGNOSIS — Z794 Long term (current) use of insulin: Secondary | ICD-10-CM | POA: Diagnosis not present

## 2019-10-31 DIAGNOSIS — Z7901 Long term (current) use of anticoagulants: Secondary | ICD-10-CM | POA: Diagnosis not present

## 2019-10-31 DIAGNOSIS — I2510 Atherosclerotic heart disease of native coronary artery without angina pectoris: Secondary | ICD-10-CM | POA: Insufficient documentation

## 2019-10-31 LAB — CBC
HCT: 40.5 % (ref 39.0–52.0)
Hemoglobin: 12.8 g/dL — ABNORMAL LOW (ref 13.0–17.0)
MCH: 27.5 pg (ref 26.0–34.0)
MCHC: 31.6 g/dL (ref 30.0–36.0)
MCV: 86.9 fL (ref 80.0–100.0)
Platelets: 293 10*3/uL (ref 150–400)
RBC: 4.66 MIL/uL (ref 4.22–5.81)
RDW: 13.4 % (ref 11.5–15.5)
WBC: 4.7 10*3/uL (ref 4.0–10.5)
nRBC: 0 % (ref 0.0–0.2)

## 2019-10-31 LAB — TROPONIN I (HIGH SENSITIVITY)
Troponin I (High Sensitivity): 16 ng/L (ref <18)
Troponin I (High Sensitivity): 21 ng/L — ABNORMAL HIGH (ref <18)

## 2019-10-31 LAB — CBG MONITORING, ED
Glucose-Capillary: 444 mg/dL — ABNORMAL HIGH (ref 70–99)
Glucose-Capillary: 326 mg/dL — ABNORMAL HIGH (ref 70–99)

## 2019-10-31 LAB — BASIC METABOLIC PANEL
Anion gap: 14 (ref 5–15)
BUN: 21 mg/dL (ref 8–23)
CO2: 24 mmol/L (ref 22–32)
Calcium: 9.5 mg/dL (ref 8.9–10.3)
Chloride: 98 mmol/L (ref 98–111)
Creatinine, Ser: 1.55 mg/dL — ABNORMAL HIGH (ref 0.61–1.24)
GFR, Estimated: 45 mL/min — ABNORMAL LOW (ref >60)
Glucose, Bld: 490 mg/dL — ABNORMAL HIGH (ref 70–99)
Potassium: 4.1 mmol/L (ref 3.5–5.1)
Sodium: 136 mmol/L (ref 135–145)

## 2019-10-31 LAB — BRAIN NATRIURETIC PEPTIDE: B Natriuretic Peptide: 43.5 pg/mL (ref 0.0–100.0)

## 2019-10-31 MED ORDER — SODIUM CHLORIDE 0.9 % IV BOLUS
500.0000 mL | Freq: Once | INTRAVENOUS | Status: AC
  Administered 2019-10-31: 500 mL via INTRAVENOUS

## 2019-10-31 MED ORDER — INSULIN ASPART 100 UNIT/ML IV SOLN
10.0000 [IU] | Freq: Once | INTRAVENOUS | Status: AC
  Administered 2019-10-31: 10 [IU] via INTRAVENOUS

## 2019-10-31 NOTE — ED Triage Notes (Addendum)
Pt BIB [facility] from home, c/o central chest pain that started yesterday morning, worse with movement. EMS VS: BP 164/100, CBG 531. Given 324mg  asa and 2NTG, no change after NTG.

## 2019-10-31 NOTE — ED Provider Notes (Signed)
[facility]   CSN: [id number] Arrival date & time: 10/31/19  1610     History Chief Complaint  Patient presents with  . Chest Pain    Jason Joyce is a 79 y.o. male with PMHx PE (PSHx IVC/On Eliquis), HTN, Diabetes, CAD, COPD, CHF with EF 50-55% who presents to the ED today with complaint of gradual onset, constant, sharp, substernal chest pain with radiation into left shoulder x 2 days. Pt denies any worsening shortness of breath than his baseline. He was given 324 mg ASA and 2 NTG en route without relief. Pt reports "it's my heart." He states he thinks medication is causing his symptoms however cannot say if he recently had any new meds/increases or decreases in medications. CBG with EMS 531. Pt denies missing any doses of his insulin. He denies diaphoresis, nausea, vomiting, leg swelling, or any other associated symptoms.   The history is provided by the patient and medical records.   ECHO (08/13/2019) IMPRESSIONS  1. Left ventricular ejection fraction, by estimation, is 50 to 55%. The  left ventricle has low normal function. The left ventricle demonstrates  regional wall motion abnormalities (see scoring diagram/findings for  description). There is moderate  concentric left ventricular hypertrophy. Left ventricular diastolic  parameters are consistent with Grade I diastolic dysfunction (impaired  relaxation). There is mild hypokinesis of the left ventricular, basal-mid  inferoseptal wall and inferior wall.  2. Right ventricular systolic function is normal. The right ventricular  size is normal. Tricuspid regurgitation signal is inadequate for assessing  PA pressure.  3. Left atrial size was mild to moderately dilated.  4. The mitral valve is normal in structure. Trivial mitral valve  regurgitation.  5. NCC moderately calcified, rest mildly calcified.. The aortic valve is  tricuspid. Aortic valve regurgitation is mild to  moderate.  6. Aortic dilatation noted. There is borderline dilatation of the  ascending aorta measuring 38 mm.  7. The inferior vena cava is normal in size with greater than 50%  respiratory variability, suggesting right atrial pressure of 3 mmHg.   Stress Test (08/13/2019)  Nuclear stress EF: 59%.  There was no ST segment deviation noted during stress.  The study is normal.  This is a low risk study.  The left ventricular ejection fraction is normal (55-65%).   Normal resting and stress perfusion. No ischemia or infarction EF 58%     Past Medical History:  Diagnosis Date  . CHF (congestive heart failure) (HCC)   . COPD (chronic obstructive pulmonary disease) (HCC)   . Coronary artery disease   . Diabetes mellitus without complication (HCC)   . Hypertension   . Pulmonary embolism [facility](HCC)     Patient Active Problem List   Diagnosis Date Noted  . 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 Neovascular glaucoma, right eye, severe stage 07/22/2019  . DOE (dyspnea on exertion) 07/14/2019  . Presence of IVC filter 07/14/2019  . 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 Central retinal vein occlusion with macular edema of right eye 07/07/2019  . Nuclear sclerotic cataract of both eyes 07/07/2019  . DIABETES MELLITUS, TYPE II 10/31/2006  . PERIPHERAL NEUROPATHY 10/31/2006  . Essential hypertension 10/31/2006  . SEIZURE DISORDER 10/31/2006    Past Surgical History:  Procedure Laterality Date  . IVC FILTER INSERTION    . PENILE PROSTHESIS IMPLANT  about 2010       Family History  Problem Relation Age of Onset  .  Diabetes Mother   . Heart disease Mother   . Kidney disease Mother     Social History   Tobacco Use  . Smoking status: Former Smoker    Packs/day: 1.00    Years: 40.00    Pack years: 40.00    Types: Cigarettes    Quit date: 01/01/1994    Years since quitting: 25.8  . Smokeless tobacco: Never Used  Substance  Use Topics  . Alcohol use: Not Currently  . Drug use: Never    Home Medications Prior to Admission medications   Medication Sig Start Date End Date Taking? Authorizing Provider  aspirin EC 81 MG tablet Take 81 mg by mouth daily. Swallow whole.    [provider]  atorvastatin (LIPITOR) 40 MG tablet Take 40 mg by mouth at bedtime.    [provider]  Baclofen 5 MG TABS Take 5 mg by mouth in the morning, at noon, and at bedtime. X 14 days 06/02/19   [provider]  brimonidine (ALPHAGAN) 0.15 % ophthalmic solution Place 1 drop into the right eye every 8 (eight) hours. 09/16/19 09/15/20  Rankin, Alford Highland, MD  doxazosin (CARDURA) 4 MG tablet Take 1 tablet (4 mg total) by mouth 2 (two) times daily. 07/14/19   Nyoka Cowden, MD  ELIQUIS 5 MG TABS tablet Take 5 mg by mouth 2 (two) times daily.  04/11/19   [provider]  furosemide (LASIX) 40 MG tablet Take 1 tablet (40 mg total) by mouth daily. 07/28/19 10/26/19  O'NealRonnald Ramp, MD  HYDROcodone-acetaminophen (NORCO/VICODIN) 5-325 MG tablet Take 1 tablet by mouth in the morning and at bedtime. 07/20/19   [provider]  Insulin Glargine (BASAGLAR KWIKPEN) 100 UNIT/ML Inject 29 Units into the skin at bedtime. Per RPh @@ [employer] - this has not been picked up yet.    [provider]  insulin NPH-regular Human (70-30) 100 UNIT/ML injection Inject 15-25 Units into the skin See admin instructions. Per sliding scale.    [provider]  latanoprost (XALATAN) 0.005 % ophthalmic solution Place 1 drop into the left eye at bedtime. Patient advised that he gets a shot in his right eye and uses eye drops in his left. 06/01/19   [provider]  losartan (COZAAR) 100 MG tablet Take 100 mg by mouth daily.  05/28/19   [provider]  metFORMIN (GLUCOPHAGE) 1000 MG tablet Take 1,000 mg by mouth 2 (two) times daily with a meal.    [provider]  metoprolol tartrate (LOPRESSOR)  50 MG tablet Take 50 mg by mouth 2 (two) times daily.  05/28/19   [provider]  NIFEdipine (ADALAT CC) 90 MG 24 hr tablet Take 90 mg by mouth daily.  05/28/19   [provider]    Allergies    Patient has no known allergies.  Review of Systems   Review of Systems  Constitutional: Negative for chills and fever.  Respiratory: Positive for shortness of breath (baseline).   Cardiovascular: Positive for chest pain. Negative for palpitations and leg swelling.  Gastrointestinal: Negative for abdominal pain, nausea and vomiting.  All other systems reviewed and are negative.   Physical Exam Updated Vital Signs BP 123/76 (BP Location: Left Arm)   Pulse 88   Temp 98.5 F (36.9 C) (Oral)   Resp 20   SpO2 98%   Physical Exam Vitals and nursing note reviewed.  Constitutional:      Appearance: He is obese. He is not ill-appearing  or diaphoretic.     Comments: Hard of hearing  HENT:     Head: Normocephalic and atraumatic.  Eyes:     Conjunctiva/sclera: Conjunctivae normal.  Cardiovascular:     Rate and Rhythm: Normal rate and regular rhythm.     Pulses:          Radial pulses are 2+ on the right side and 2+ on the left side.       Dorsalis pedis pulses are 2+ on the right side and 2+ on the left side.     Heart sounds: Normal heart sounds.  Pulmonary:     Effort: Pulmonary effort is normal.     Breath sounds: Normal breath sounds. No decreased breath sounds, wheezing, rhonchi or rales.  Chest:     Chest wall: Tenderness present.  Abdominal:     Palpations: Abdomen is soft.     Tenderness: There is no abdominal tenderness. There is no guarding or rebound.  Musculoskeletal:     Cervical back: Neck supple.     Right lower leg: No tenderness. Edema present.     Left lower leg: No tenderness. Edema present.     Comments: Nonpitting edema bilaterally  Skin:    General: Skin is warm and dry.  Neurological:     Mental Status: He is alert.     ED Results /  Procedures / Treatments   Labs (all labs ordered are listed, but only abnormal results are displayed) Labs Reviewed  BASIC METABOLIC PANEL - Abnormal; Notable for the following components:      Result Value   Glucose, Bld 490 (*)    Creatinine, Ser 1.55 (*)    GFR, Estimated 45 (*)    All other components within normal limits  CBC - Abnormal; Notable for the following components:   Hemoglobin 12.8 (*)    All other components within normal limits  CBG MONITORING, ED - Abnormal; Notable for the following components:   Glucose-Capillary 444 (*)    All other components within normal limits  CBG MONITORING, ED - Abnormal; Notable for the following components:   Glucose-Capillary 326 (*)    All other components within normal limits  TROPONIN I (HIGH SENSITIVITY) - Abnormal; Notable for the following components:   Troponin I (High Sensitivity) 21 (*)    All other components within normal limits  BRAIN NATRIURETIC PEPTIDE  TROPONIN I (HIGH SENSITIVITY)    EKG EKG Interpretation  Date/Time:  Saturday October 31 2019 16:11:13 EDT Ventricular Rate:  90 PR Interval:  190 QRS Duration: 154 QT Interval:  412 QTC Calculation: 504 R Axis:   -64 Text Interpretation: Normal sinus rhythm Right bundle branch block Left anterior fascicular block  Bifascicular block  Abnormal ECG No sig change from July 2021 eceg, no STEMI Confirmed by Alvester Chou [phone]) on 10/31/2019 4:22:20 PM   Radiology DG Chest 2 View  Result Date: 10/31/2019 CLINICAL DATA:  Chest pain EXAM: CHEST - 2 VIEW COMPARISON:  07/20/2019 FINDINGS: Bibasilar atelectasis. Heart is normal size. No effusions. No acute bony abnormality. IMPRESSION: Bibasilar atelectasis. Electronically Signed   By: Charlett Nose M.D.   On: 10/31/2019 17:06    Procedures Procedures (including critical care time)  Medications Ordered in ED Medications  sodium chloride 0.9 % bolus 500 mL (500 mLs Intravenous New Bag/Given 10/31/19 1927)    insulin aspart (novoLOG) injection 10 Units (10 Units Intravenous Given 10/31/19 1921)    ED Course  I have reviewed the triage vital signs and  the nursing notes.  Pertinent labs & imaging results that were available during my care of the patient were reviewed by me and considered in my medical decision making (see chart for details).    MDM Rules/Calculators/A&P                          79 year old male presenting to the ED today with complaint of substernal chest pain x 2 days with radiation into left shoulder. No improvement with 324 mg ASA and 2 NTG en route. Found to be hyperglycemic with CBG 531. On exam pt is very hard of hearing. He needs to be shouted at to obtain any history however history limited due to pt being poor historian. He continues to say "it's my heart" when asking what is bringing him to the ED today. Pt has no other acute complaints at this time however does have chronic complaints that need to be addressed by PCP. Will work up for chest pain at this time. Per chart review pt did have recent echo and stress test without acute findings besides LVEF 50-55%. He does not appear fluid overloaded at this time.   EKG without significant change since last tracing CXR with bibasilar atelectasis; no other findings CBC without leukocytosis. Hgb stable at 12.8  Hemoglobin  Date Value Ref Range Status  10/31/2019 12.8 (L) 13.0 - 17.0 g/dL Final  58/52/7782 42.3 (L) 13.0 - 17.0 g/dL Final  53/61/4431 54.0 (L) 13.0 - 17.0 g/dL Final   BMP with glucose 490 and creatinine 1.55. Will provide small amount of fluids and 10 units IV insulin.   Lab Results  Component Value Date   CREATININE 1.55 (H) 10/31/2019   CREATININE 1.16 07/20/2019   CREATININE 1.35 (H) 04/12/2019   Troponin 16. Will repeat.  BNP normal 43.5  Repeat troponin 21. Given chest pain has been ongoing for 2 days I would suspect troponin to be much more elevated if there was concern for any blockage vs unstable  angina.  Repeat CBG 326.  Do not feel pt requires admission at this time as he has a cardiologist he can follow up with. It appears that he has had similar chest pains in the past during previous cardiology visits. Will discharge patient home at this time. He has been evaluated by attending physician Dr. Rosalia Hammers who agrees with plan. Strict return precautions discussed. Pt is in agreement with plan and stable for discharge home.   This note was prepared using Dragon voice recognition software and may include unintentional dictation errors due to the inherent limitations of voice recognition software.  Final Clinical Impression(s) / ED Diagnoses Final diagnoses:  Nonspecific chest pain  Type 2 diabetes mellitus with hyperglycemia, with long-term current use of insulin (HCC)    Rx / DC Orders ED Discharge Orders    None       Discharge Instructions     Please follow up with your cardiologist Dr. Flora Lipps regarding your chest pain Continue monitoring your blood glucose levels at home. Take your insulin as indicated. Follow up with your PCP for your elevated sugars today.   Return to the ED for any worsening symptoms       Tanda Rockers, PA-C 10/31/19 2100    Margarita Grizzle, MD 11/02/19 [phone]

## 2019-10-31 NOTE — ED Notes (Signed)
Discharge instructions discussed with pt. Pt verbalized understanding. Pt stable and ambulatory. No signature pad available. 

## 2019-10-31 NOTE — Discharge Instructions (Addendum)
Please follow up with your cardiologist Dr. Flora Lipps regarding your chest pain Continue monitoring your blood glucose levels at home. Take your insulin as indicated. Follow up with your PCP for your elevated sugars today.   Return to the ED for any worsening symptoms

## 2019-11-17 ENCOUNTER — Other Ambulatory Visit: Payer: Self-pay

## 2019-11-17 ENCOUNTER — Encounter (INDEPENDENT_AMBULATORY_CARE_PROVIDER_SITE_OTHER): Payer: Self-pay | Admitting: Ophthalmology

## 2019-11-17 ENCOUNTER — Ambulatory Visit (INDEPENDENT_AMBULATORY_CARE_PROVIDER_SITE_OTHER): Payer: Medicare PPO | Admitting: Ophthalmology

## 2019-11-17 DIAGNOSIS — H4051X3 Glaucoma secondary to other eye disorders, right eye, severe stage: Secondary | ICD-10-CM

## 2019-11-17 DIAGNOSIS — H211X1 Other vascular disorders of iris and ciliary body, right eye: Secondary | ICD-10-CM | POA: Diagnosis not present

## 2019-11-17 DIAGNOSIS — H348111 Central retinal vein occlusion, right eye, with retinal neovascularization: Secondary | ICD-10-CM | POA: Diagnosis not present

## 2019-11-17 NOTE — Progress Notes (Signed)
11/17/2019     CHIEF COMPLAINT Patient presents for Retina Follow Up   HISTORY OF PRESENT ILLNESS: Jason Joyce is a 79 y.o. male who presents to the clinic today for:   HPI    Retina Follow Up    Patient presents with  CRVO/BRVO.  In right eye.  This started 3 weeks ago.  Severity is mild.  Duration of 3 weeks.  Since onset it is stable.          Comments    3 WK F/U, IOP Check (no dilation)          Last edited by Varney Biles D on 11/17/2019  3:04 PM. (History)      [place] [address] [place],  [place] [address]  HISTORICAL INFORMATION:   Selected notes from the MEDICAL RECORD NUMBER       CURRENT MEDICATIONS: Current Outpatient Medications (Ophthalmic Drugs)  Medication Sig  . brimonidine (ALPHAGAN) 0.15 % ophthalmic solution Place 1 drop into the right eye every 8 (eight) hours.  Marland Kitchen latanoprost (XALATAN) 0.005 % ophthalmic solution Place 1 drop into the left eye at bedtime. Patient advised that he gets a shot in his right eye and uses eye drops in his left.   No current facility-administered medications for this visit. (Ophthalmic Drugs)   Current Outpatient Medications (Other)  Medication Sig  . aspirin EC 81 MG tablet Take 81 mg by mouth daily. Swallow whole.  Marland Kitchen atorvastatin (LIPITOR) 40 MG tablet Take 40 mg by mouth at bedtime.  . Baclofen 5 MG TABS Take 5 mg by mouth in the morning, at noon, and at bedtime. X 14 days  . doxazosin (CARDURA) 4 MG tablet Take 1 tablet (4 mg total) by mouth 2 (two) times daily.  Marland Kitchen ELIQUIS 5 MG TABS tablet Take 5 mg by mouth 2 (two) times daily.   . furosemide (LASIX) 40 MG tablet Take 1 tablet (40 mg total) by mouth daily.  Marland Kitchen HYDROcodone-acetaminophen (NORCO/VICODIN) 5-325 MG tablet Take 1 tablet by mouth in the morning and at bedtime.  . Insulin Glargine (BASAGLAR KWIKPEN) 100 UNIT/ML Inject 29 Units into the skin at bedtime. Per RPh @@ [facility] - this has  not been picked up yet.  . insulin NPH-regular Human (70-30) 100 UNIT/ML injection Inject 15-25 Units into the skin See admin instructions. Per sliding scale.  . losartan (COZAAR) 100 MG tablet Take 100 mg by mouth daily.   . metFORMIN (GLUCOPHAGE) 1000 MG tablet Take 1,000 mg by mouth 2 (two) times daily with a meal.  . metoprolol tartrate (LOPRESSOR) 50 MG tablet Take 50 mg by mouth 2 (two) times daily.   Marland Kitchen NIFEdipine (ADALAT CC) 90 MG 24 hr tablet Take 90 mg by mouth daily.    No current facility-administered medications for this visit. (Other)      REVIEW OF SYSTEMS:    ALLERGIES No Known Allergies  PAST MEDICAL HISTORY Past Medical History:  Diagnosis Date  . CHF (congestive heart failure) (HCC)   . COPD (chronic obstructive pulmonary disease) (HCC)   . Coronary artery disease   . Diabetes mellitus without complication (HCC)   . Hypertension   . Pulmonary embolism [facility])    Past Surgical History:  Procedure Laterality Date  . IVC FILTER INSERTION    . PENILE PROSTHESIS IMPLANT  about 2010    FAMILY HISTORY Family History  Problem Relation Age of Onset  . Diabetes Mother   .  Heart disease Mother   . Kidney disease Mother     SOCIAL HISTORY Social History   Tobacco Use  . Smoking status: Former Smoker    Packs/day: 1.00    Years: 40.00    Pack years: 40.00    Types: Cigarettes    Quit date: 01/01/1994    Years since quitting: 25.8  . Smokeless tobacco: Never Used  Substance Use Topics  . Alcohol use: Not Currently  . Drug use: Never         OPHTHALMIC EXAM:  Base Eye Exam    Visual Acuity (ETDRS)      Right Left   Dist [place] HM 20/40 -1   Dist ph [place]  NI       Tonometry (Tonopen, 3:08 PM)      Right Left   Pressure 21 21       Pupils      Dark Light Shape React APD   Right 4 3 Round Brisk +1   Left 4 3 Round Brisk None       Visual Fields      Left Right    Full Full       Extraocular Movement      Right Left    Full Full        Neuro/Psych    Oriented x3: Yes   Mood/Affect: Normal       Dilation   No dilation        Slit Lamp and Fundus Exam    External Exam      Right Left   External Normal Normal       Slit Lamp Exam      Right Left   Lids/Lashes Normal Normal   Conjunctiva/Sclera White and quiet White and quiet   Cornea Clear Clear   Anterior Chamber Deep and quiet Deep and quiet   Iris Poor review of detail with wheelchair examination Round and reactive   Lens 3+ Nuclear sclerosis    Anterior Vitreous Normal Normal       Fundus Exam      Right Left   Posterior Vitreous Normal    Disc Normal    C/D Ratio 0.4    Macula Microaneurysms, Hemorrhage    Vessels Ischemic CRV O, intraretinal hemorrhage seen with small pupil indirect scope though poorly dilated, room inferonasal for more PRP in the future.    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  We will continue to monitor and will add complete completion of PRP in the near future OD.  Best visual acuity has been achieved.  Neovascular glaucoma, right eye, severe stage Intraocular pressure today is normalized, much improved on therapy.  Iris neovascularization, right Less active OD.      ICD-10-CM   1. 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 Neovascular glaucoma, right eye, severe stage  H40.51X3   3. Iris neovascularization, right  H21.1X1     1.  PRP has improved neovascular glaucoma nicely in the right eye.  There is a large region of nonperfusion seen on 25 diopter examination  today 3 poorly dilated pupil will treat this inferonasal  quadrant in the right eye to confirm that the right I will not reenter neovascular glaucoma  2.  3.  Ophthalmic Meds Ordered this visit:  No orders of the defined types were placed in this encounter.      Return in about 3 weeks (around 12/08/2019) for dilate, OD, PRP.  There are no Patient Instructions on file for this visit.   Explained the diagnoses, plan, and follow up with the patient and they expressed understanding.  Patient expressed understanding of the importance of proper follow up care.   Alford Highland Jaquay Morneault M.D. Diseases & Surgery of the Retina and Vitreous [facility] 11/17/19     Abbreviations: M myopia (nearsighted); A astigmatism; H hyperopia (farsighted); P presbyopia; Mrx spectacle prescription;  CTL contact lenses; OD right eye; OS left eye; OU both eyes  XT exotropia; ET esotropia; PEK punctate epithelial keratitis; PEE punctate epithelial erosions; DES dry eye syndrome; MGD meibomian gland dysfunction; ATs artificial tears; PFAT's preservative free artificial tears; NSC nuclear sclerotic cataract; PSC posterior subcapsular cataract; ERM epi-retinal membrane; PVD posterior vitreous detachment; RD retinal detachment; DM diabetes mellitus; DR diabetic retinopathy; NPDR non-proliferative diabetic retinopathy; PDR proliferative diabetic retinopathy; CSME clinically significant macular edema; DME diabetic macular edema; dbh dot blot hemorrhages; CWS cotton wool spot; POAG primary open angle glaucoma; C/D cup-to-disc ratio; HVF humphrey visual field; GVF goldmann visual field; OCT optical coherence tomography; IOP intraocular pressure; BRVO Branch retinal vein occlusion; CRVO central retinal vein occlusion; CRAO central retinal artery occlusion; BRAO branch retinal artery occlusion; RT retinal tear; SB scleral buckle; PPV pars plana vitrectomy; VH Vitreous hemorrhage; PRP panretinal laser photocoagulation; IVK intravitreal kenalog; VMT vitreomacular traction; MH  Macular hole;  NVD neovascularization of the disc; NVE neovascularization elsewhere; AREDS age related eye disease study; ARMD age related macular degeneration; POAG primary open angle glaucoma; EBMD epithelial/anterior basement membrane dystrophy; ACIOL anterior chamber intraocular lens; IOL intraocular lens; PCIOL posterior chamber intraocular lens; Phaco/IOL phacoemulsification with intraocular lens placement; PRK photorefractive keratectomy; LASIK laser assisted in situ keratomileusis; HTN hypertension; DM diabetes mellitus; COPD chronic obstructive pulmonary disease

## 2019-11-17 NOTE — Assessment & Plan Note (Signed)
Neovascular glaucoma is now under control with topical medical therapy and post PRP for neovascularization secondary to central retinal vein occlusion OD.  We will continue to monitor and will add complete completion of PRP in the near future OD.  Best visual acuity has been achieved.

## 2019-11-17 NOTE — Patient Instructions (Signed)
-  Continue drops as directed.

## 2019-11-17 NOTE — Assessment & Plan Note (Signed)
Less active OD.

## 2019-11-17 NOTE — Assessment & Plan Note (Signed)
Intraocular pressure today is normalized, much improved on therapy.

## 2019-11-18 ENCOUNTER — Other Ambulatory Visit (INDEPENDENT_AMBULATORY_CARE_PROVIDER_SITE_OTHER): Payer: Self-pay | Admitting: Ophthalmology

## 2019-12-08 ENCOUNTER — Encounter (INDEPENDENT_AMBULATORY_CARE_PROVIDER_SITE_OTHER): Payer: Medicare PPO | Admitting: Ophthalmology

## 2021-07-27 IMAGING — DX DG CHEST 1V PORT
1 series · 1 of 1 positions shown · non-contrast
Comparison: 06/11/2008

CLINICAL DATA: Chest pain abdominal swelling

EXAM:
PORTABLE CHEST 1 VIEW

[chest ap]
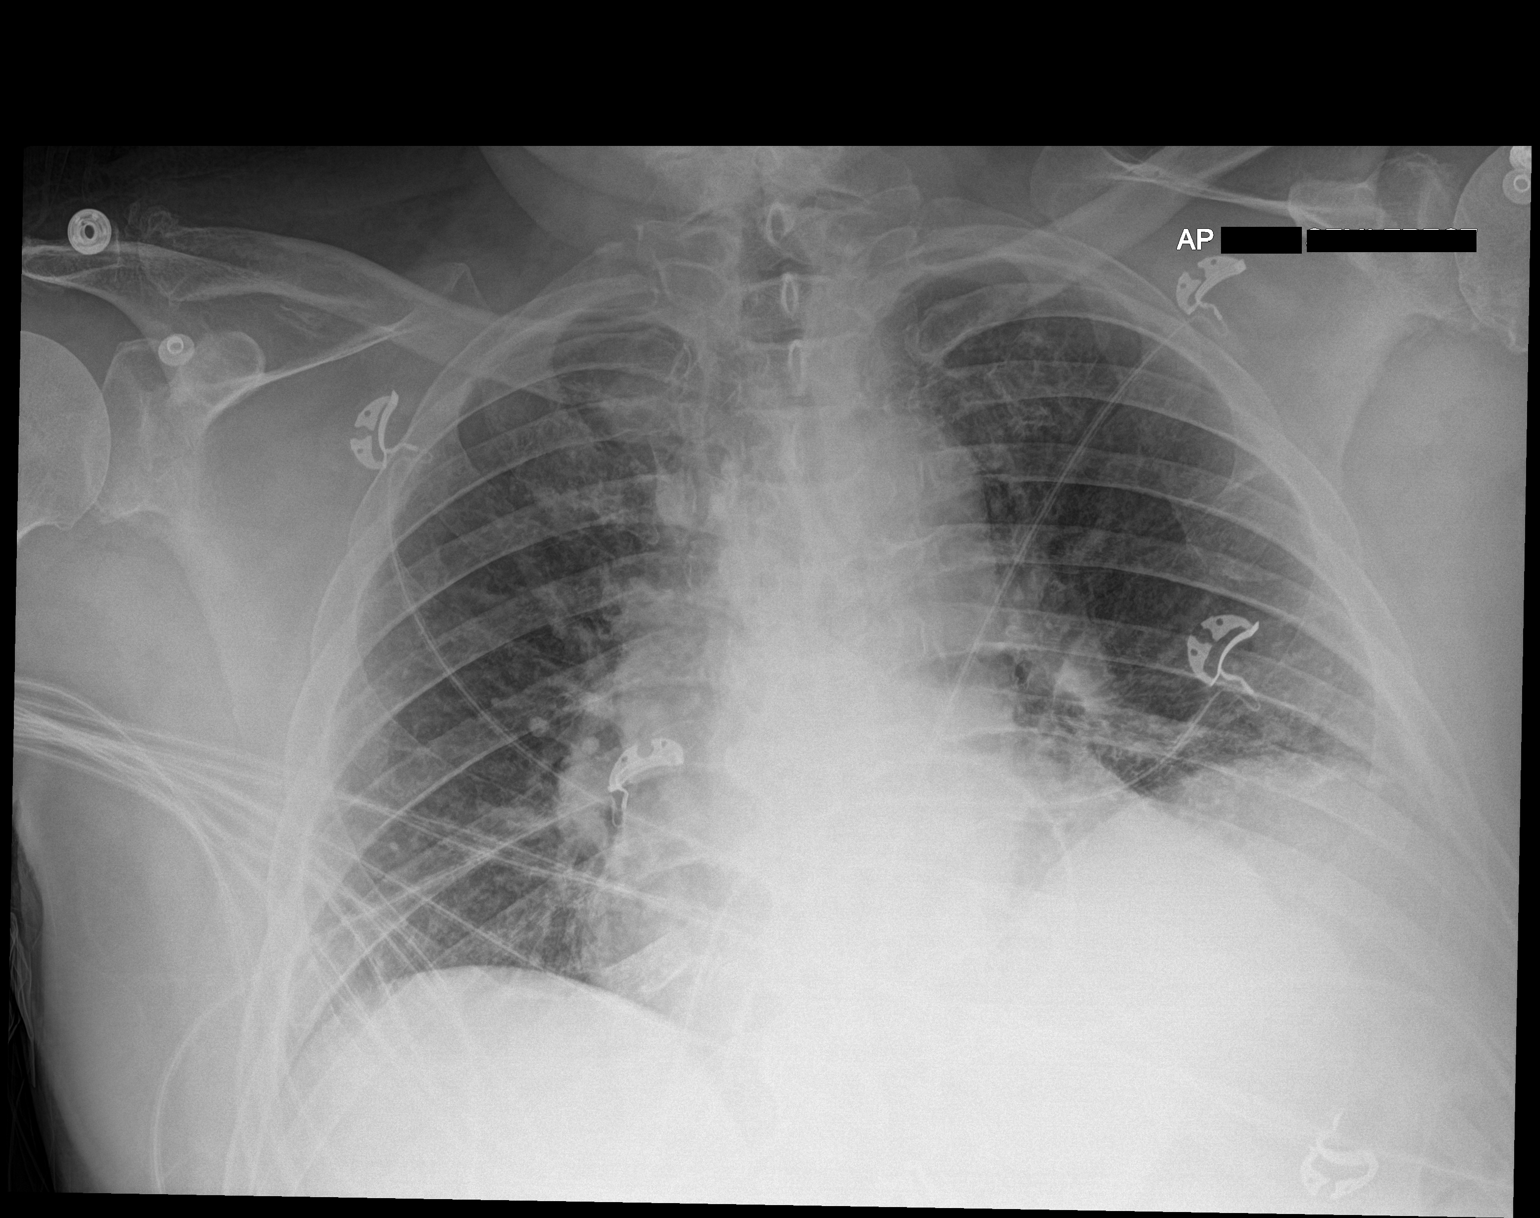

[1 of 1 positions shown; findings below may reference images not displayed]

FINDINGS: Mild elevation of the left diaphragm with basilar atelectasis. Mild
cardiomegaly with central vascular congestion. No pleural effusion
or pneumothorax
IMPRESSION: 1. Cardiomegaly with mild central vascular congestion
2. Mild left basilar atelectasis

## 2021-08-01 DEATH — deceased

## 2021-10-24 IMAGING — US US SCROTUM W/ DOPPLER COMPLETE
1 series · 14 of 25 positions shown · non-contrast
Comparison: None.

CLINICAL DATA: Infection of penile implant.

EXAM:
SCROTAL ULTRASOUND
DOPPLER ULTRASOUND OF THE TESTICLES
TECHNIQUE: Complete ultrasound examination of the testicles, epididymis, and
other scrotal structures was performed. Color and spectral Doppler
ultrasound were also utilized to evaluate blood flow to the
testicles.

[Series 1: us scrotum w/ doppler complete · 0.06mm/px · 88 acquisitions, 14 frames shown]
[im 1/88]
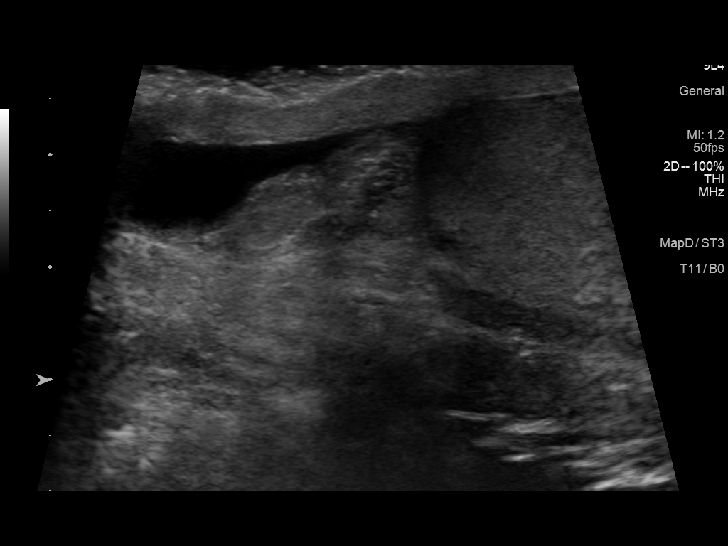
[im 8/88]
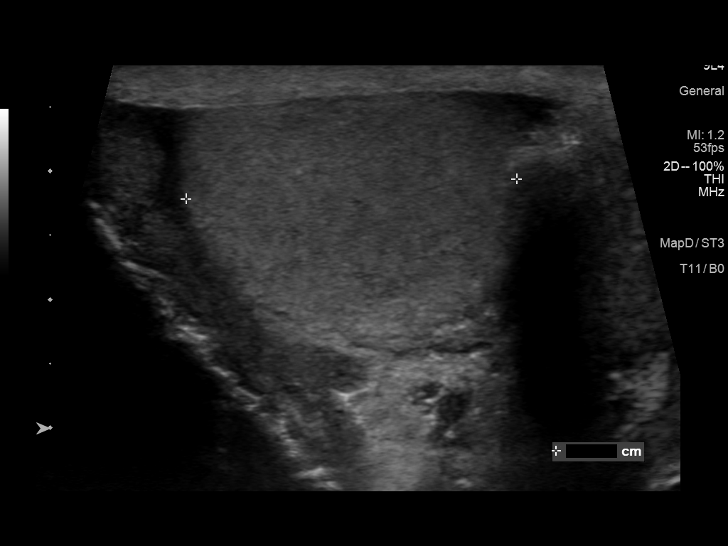
[im 15/88]
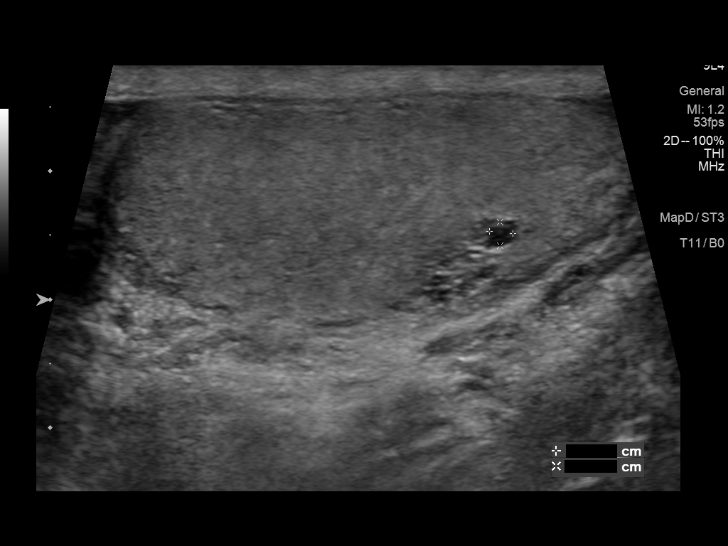
[im 22/88]
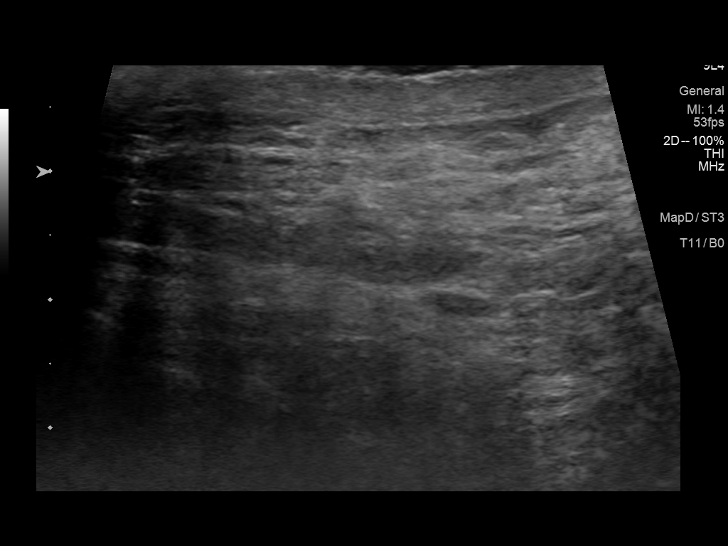
[im 30/88]
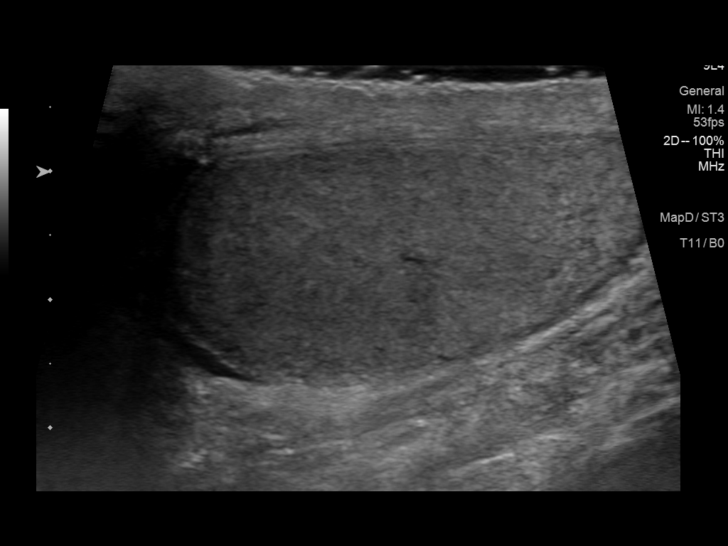
[im 33/88]
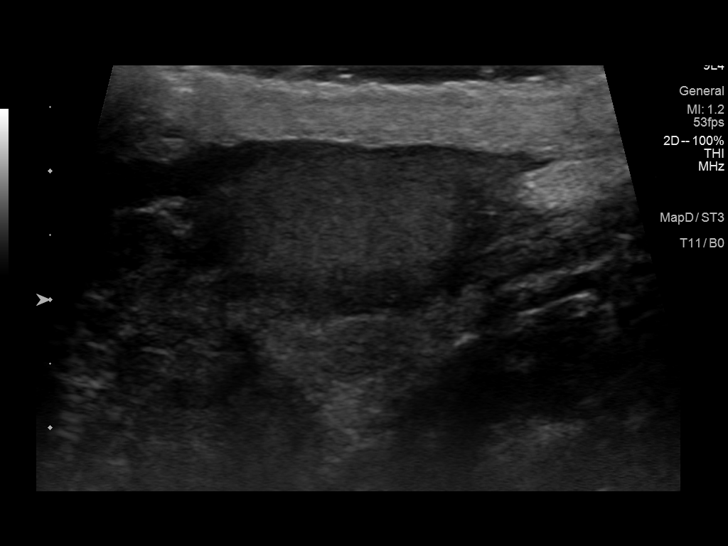
[im 40/88]
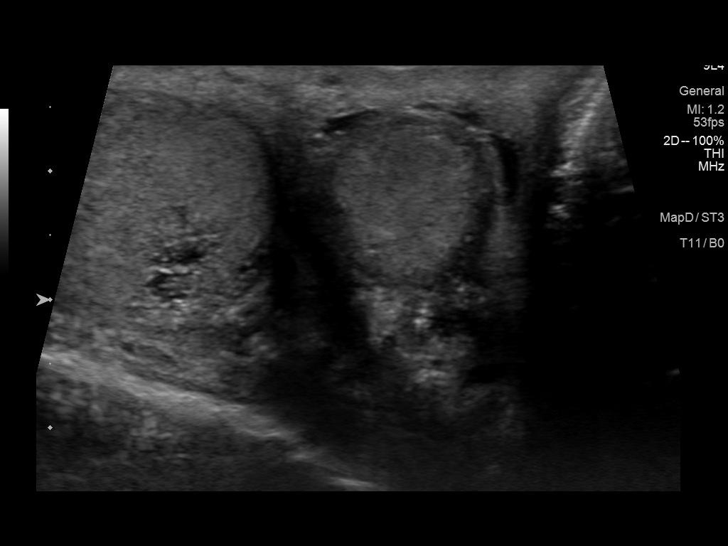
[im 48/88]
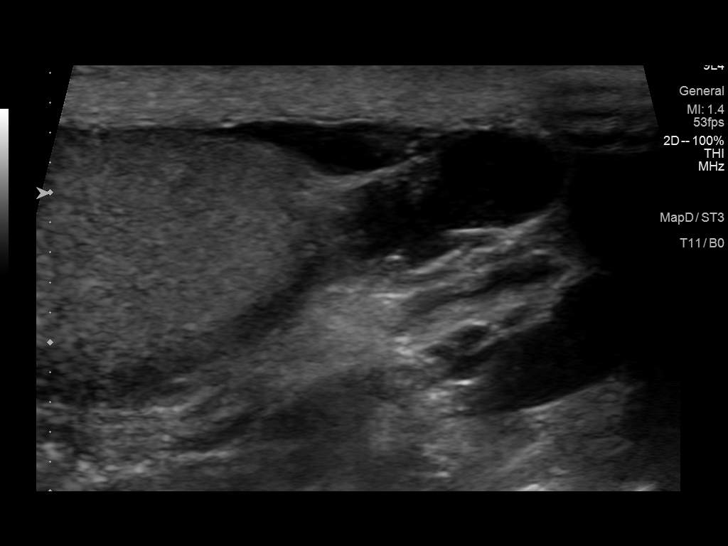
[im 55/88]
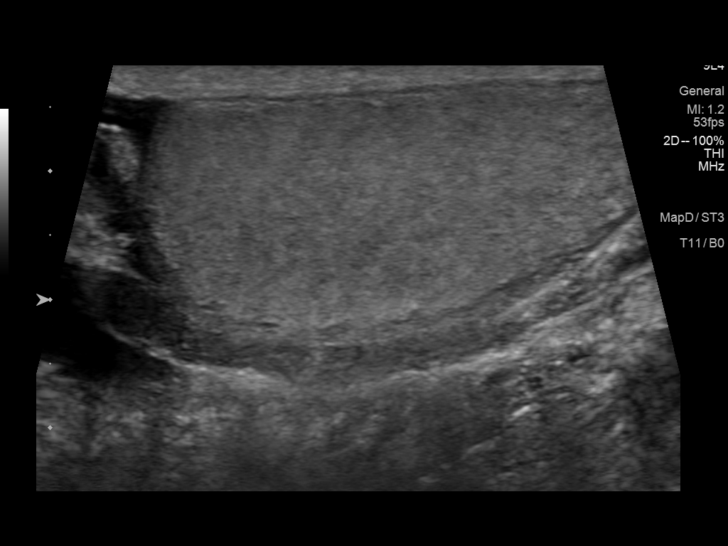
[im 59/88]
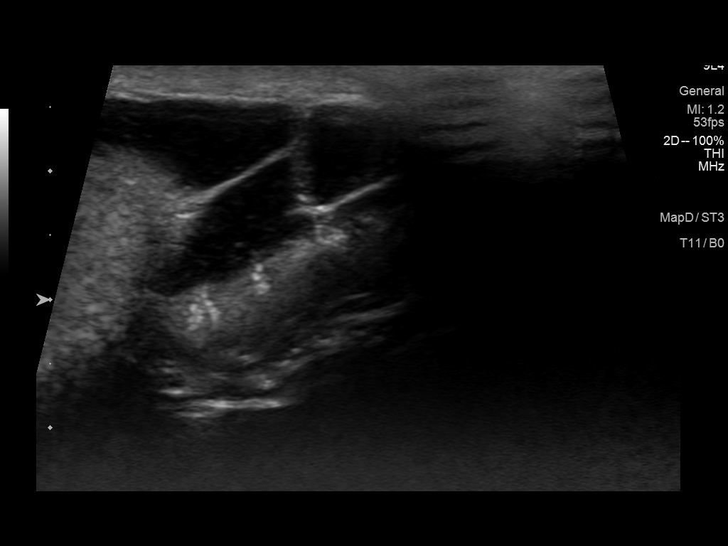
[im 66/88]
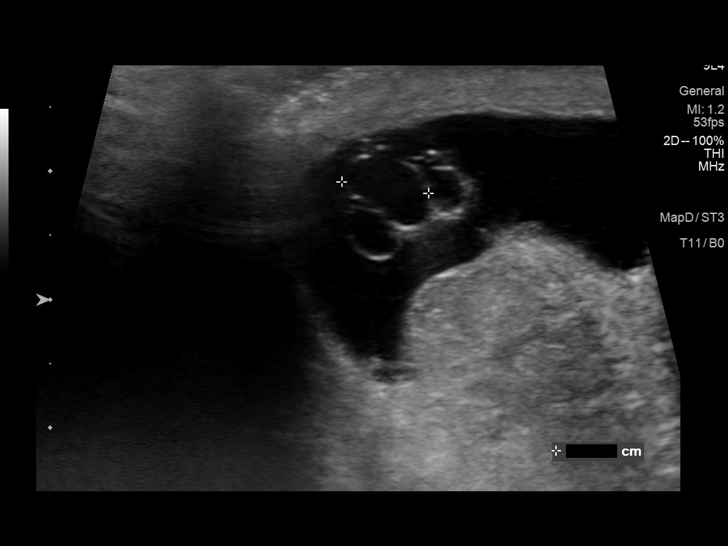
[im 73/88]
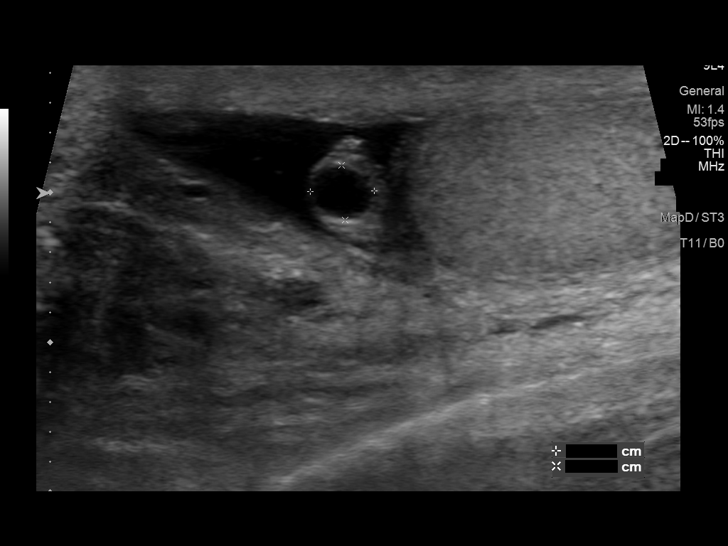
[im 80/88]
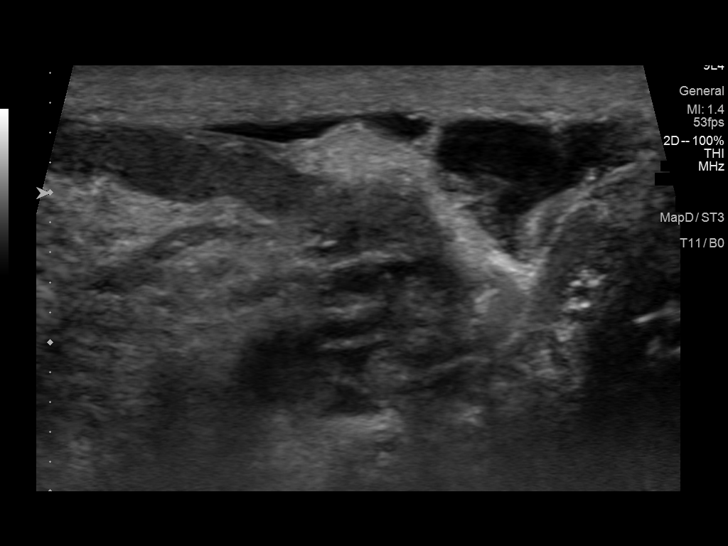
[im 88/88]
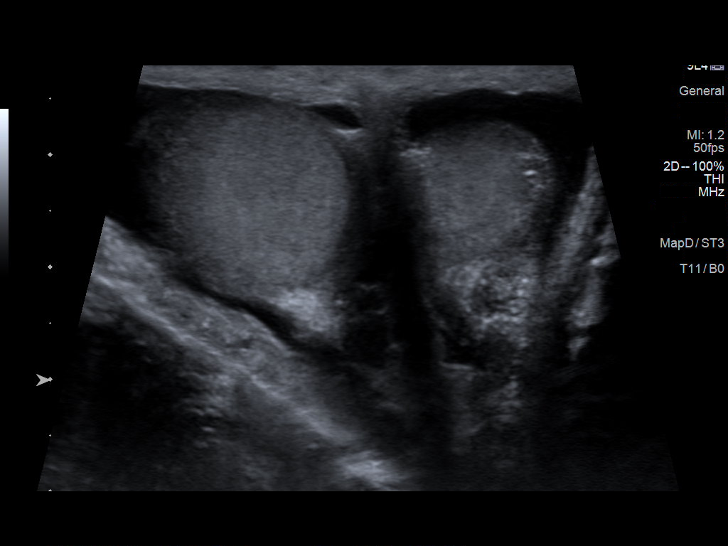

[14 of 25 positions shown; findings below may reference images not displayed]

FINDINGS: Right testicle

Measurements: 4.3 x 2 x 2.4 cm. No mass or microlithiasis
visualized.

Left testicle

Measurements: 4 x 2 x 2.3 cm. No mass or microlithiasis visualized.

Right epididymis:  Multiple cysts are noted in the epididymal head.

Left epididymis:  Multiple epididymal head cysts are noted.

Hydrocele:  There are small bilateral, slightly complex, hydroceles.

Varicocele:  None visualized.  There is scrotal wall thickening.

Pulsed Doppler interrogation of both testes demonstrates normal low
resistance arterial and venous waveforms bilaterally.
IMPRESSION: 1. No evidence for testicular mass or testicular torsion.
2. There is nonspecific scrotal wall thickening that should be
correlated with physical exam.
3. There are bilateral epididymal head cysts.
4. Small bilateral hydroceles.

## 2021-10-28 IMAGING — DX DG CHEST 2V
2 series · 2 of 2 positions shown · non-contrast
Comparison: 04/12/2019

CLINICAL DATA: Shortness of breath, dyspnea on exertion

EXAM:
CHEST - 2 VIEW

[chest pa]
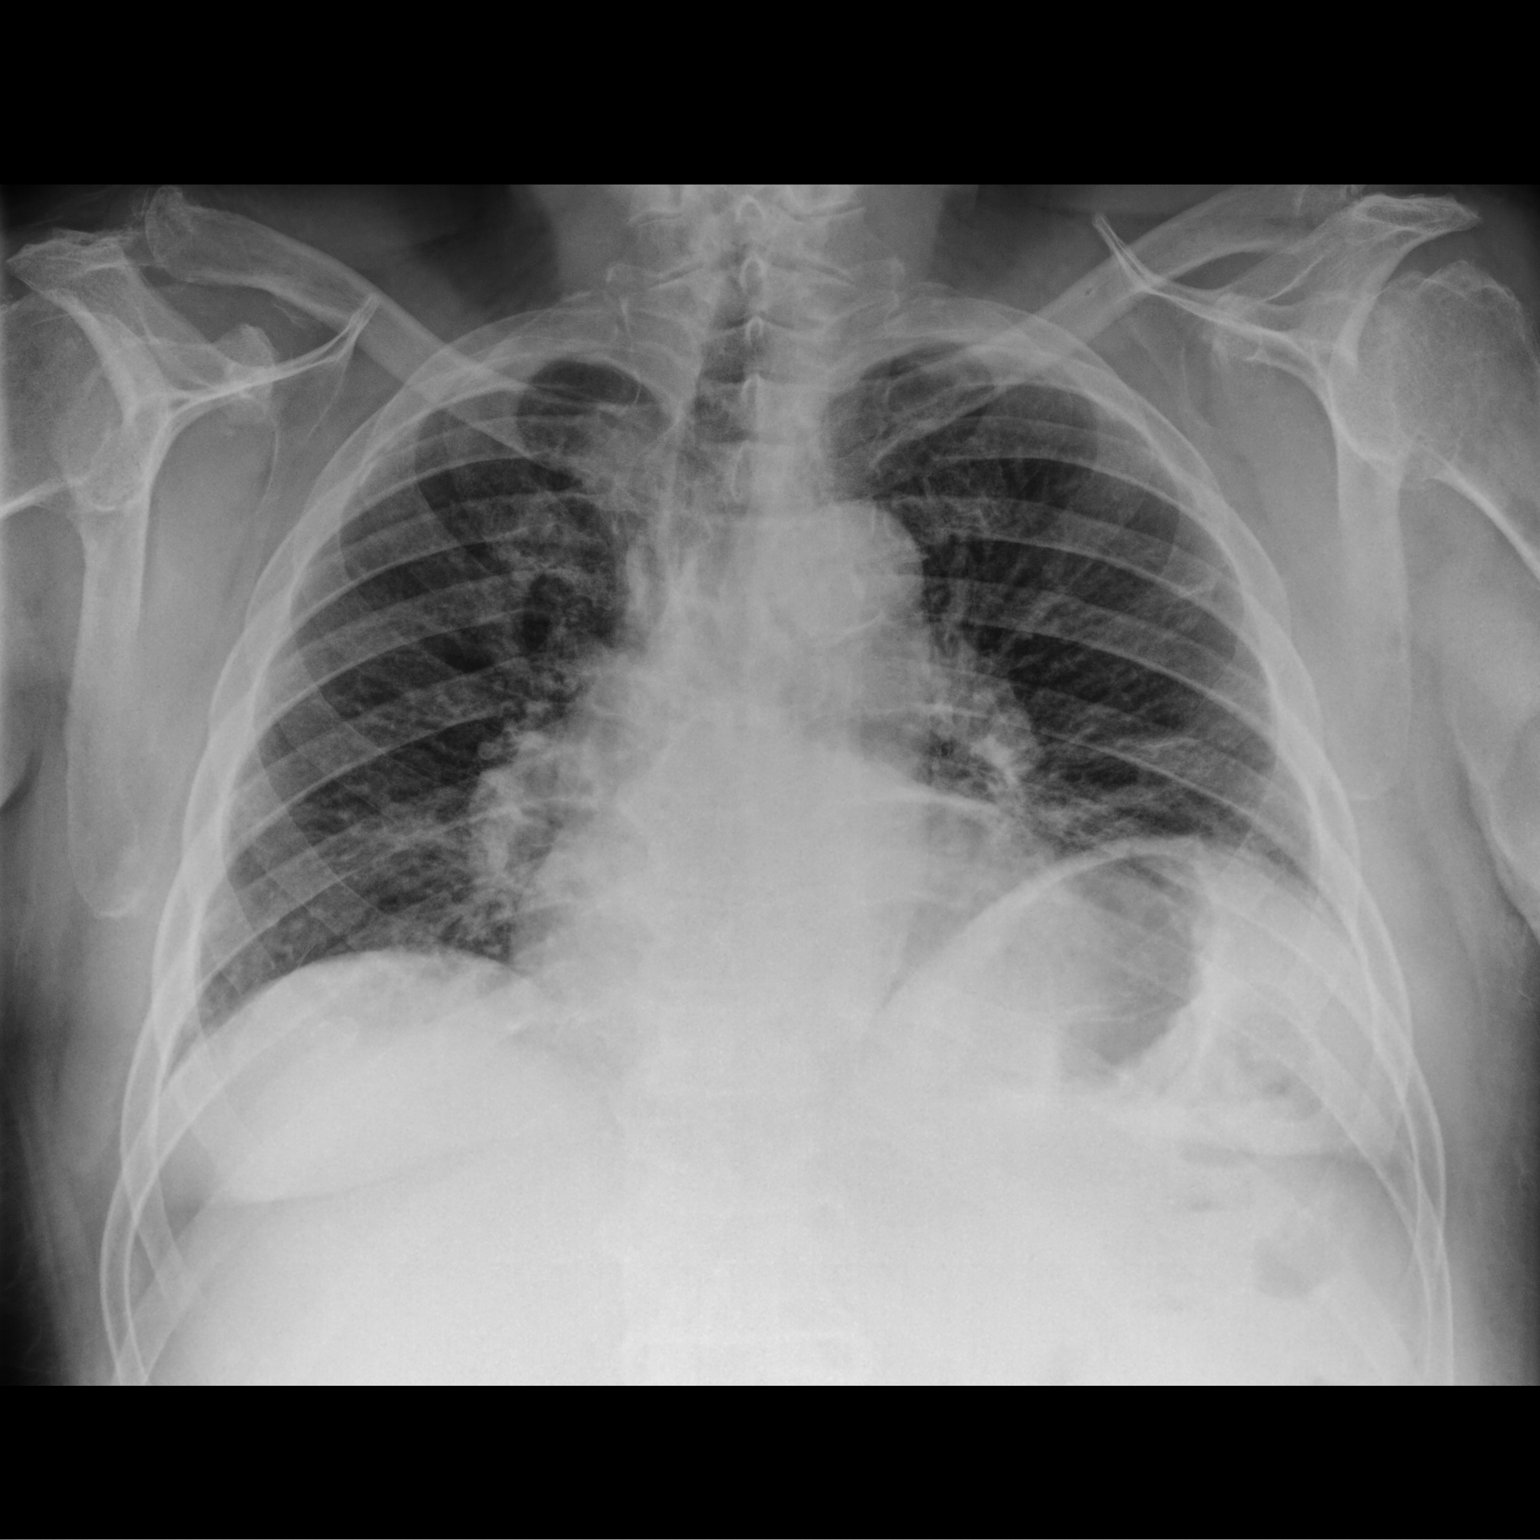

[chest lat]
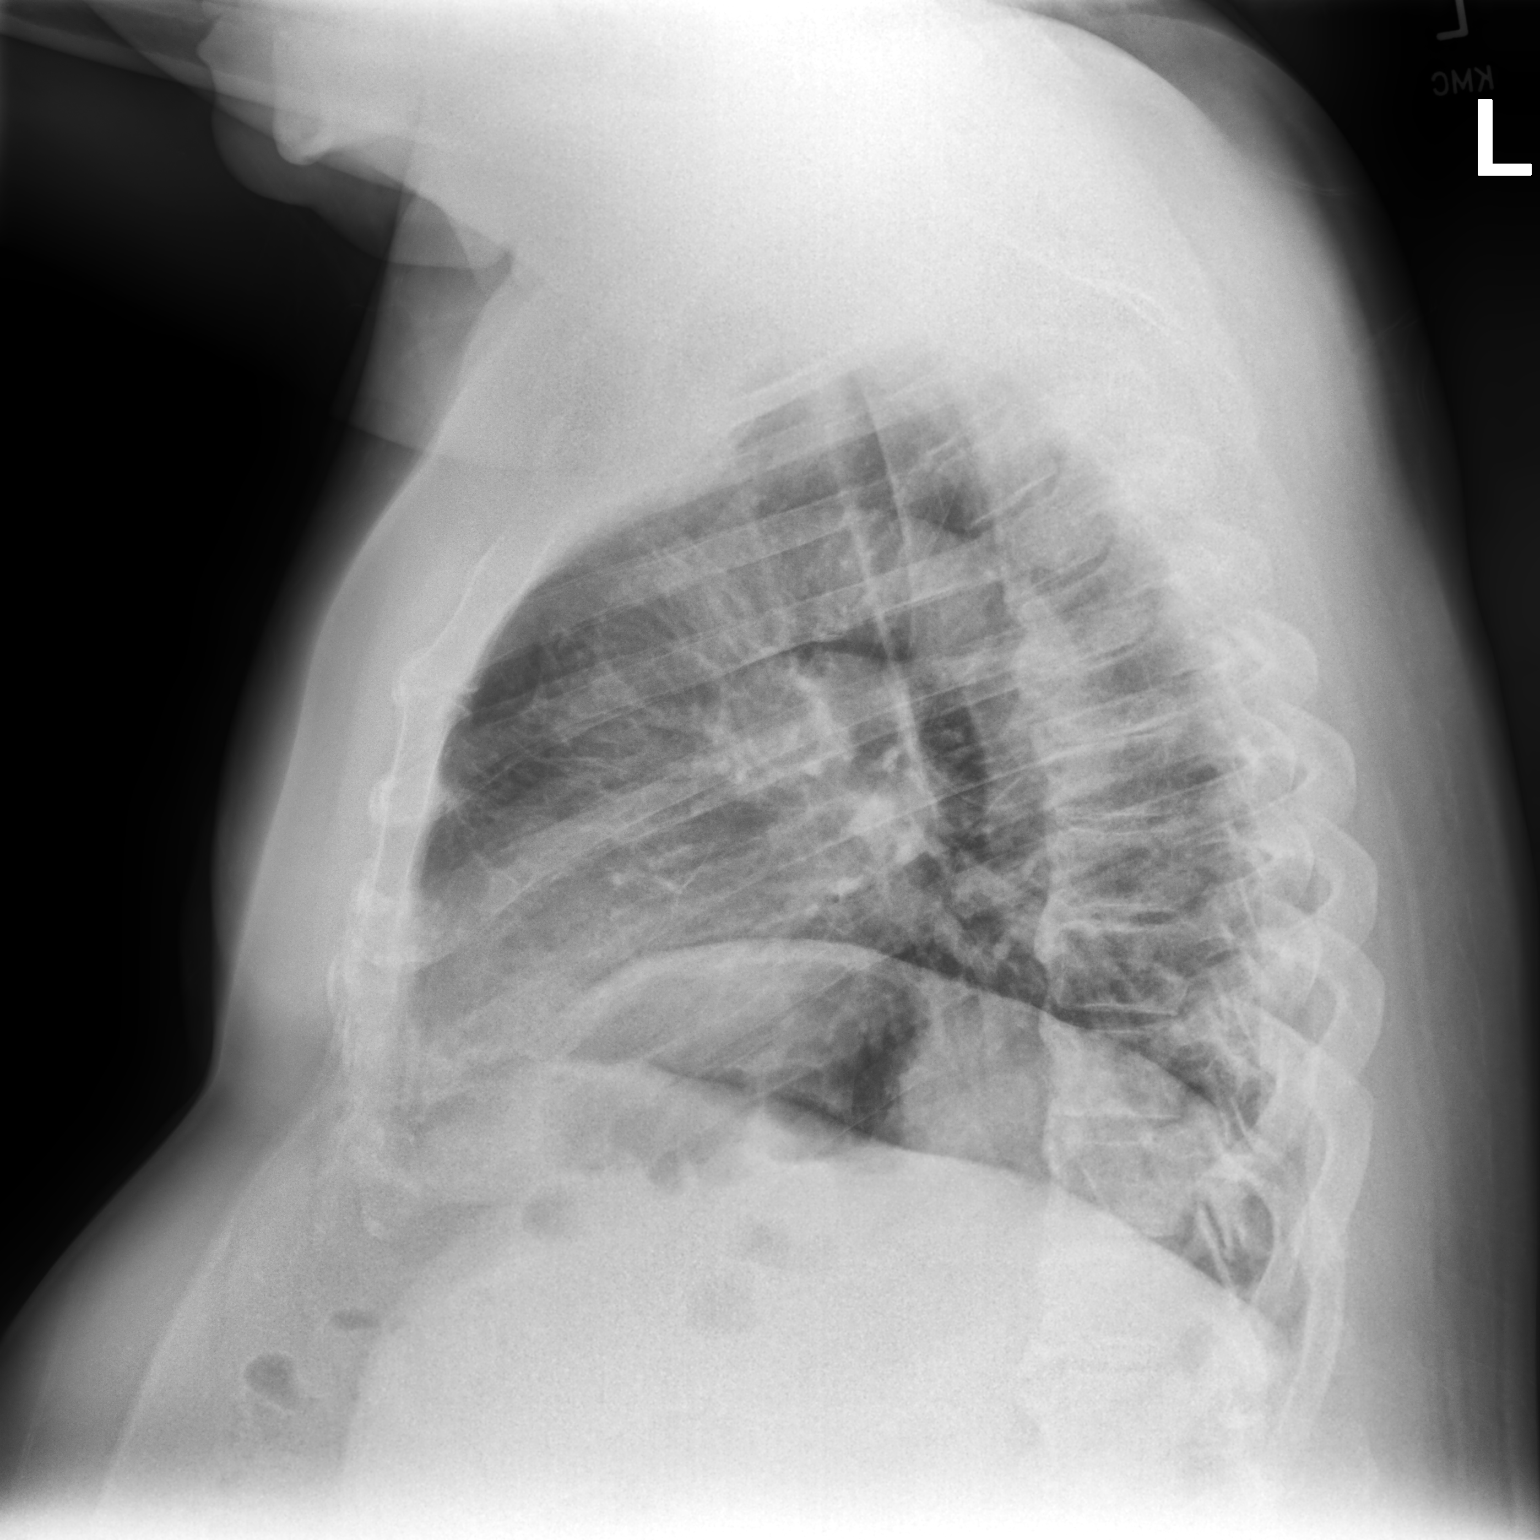

[2 of 2 positions shown; findings below may reference images not displayed]

FINDINGS: Similar low lung volumes with elevation of the left hemidiaphragm.
Stable heart size and central vascular crowding. Bibasilar bandlike
densities, favored to represent atelectasis or scarring. No definite
superimposed pneumonia, significant collapse or consolidation. No
edema pattern, effusion or pneumothorax. Trachea midline. Aorta
atherosclerotic. Degenerative changes of the spine and shoulders.
IMPRESSION: Similar cardiomegaly with vascular congestion.

Low lung volumes with bibasilar atelectasis.

Aortic Atherosclerosis ([id number]).
